# Patient Record
Sex: Female | Born: 1995 | Race: Black or African American | Hispanic: No | Marital: Married | Smoking: Current every day smoker
Health system: Southern US, Community
[De-identification: ages and names within clinical notes are randomized; demographics above are authoritative.]

## PROBLEM LIST (undated history)

## (undated) DIAGNOSIS — Z789 Other specified health status: Secondary | ICD-10-CM

## (undated) DIAGNOSIS — F419 Anxiety disorder, unspecified: Secondary | ICD-10-CM

## (undated) DIAGNOSIS — N739 Female pelvic inflammatory disease, unspecified: Secondary | ICD-10-CM

## (undated) DIAGNOSIS — I10 Essential (primary) hypertension: Secondary | ICD-10-CM

## (undated) DIAGNOSIS — T7840XA Allergy, unspecified, initial encounter: Secondary | ICD-10-CM

## (undated) HISTORY — DX: Other specified health status: Z78.9

## (undated) HISTORY — DX: Female pelvic inflammatory disease, unspecified: N73.9

## (undated) HISTORY — PX: OTHER SURGICAL HISTORY: SHX169

## (undated) HISTORY — DX: Anxiety disorder, unspecified: F41.9

## (undated) HISTORY — DX: Allergy, unspecified, initial encounter: T78.40XA

---
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
## 2016-10-23 ENCOUNTER — Emergency Department: Payer: Self-pay

## 2016-10-23 ENCOUNTER — Emergency Department
Admission: EM | Admit: 2016-10-23 | Discharge: 2016-10-23 | Disposition: A | Discharge status: Home / Self Care | Payer: Self-pay | Attending: Emergency Medicine | Admitting: Emergency Medicine

## 2016-10-23 ENCOUNTER — Encounter: Payer: Self-pay | Admitting: Emergency Medicine

## 2016-10-23 DIAGNOSIS — F17200 Nicotine dependence, unspecified, uncomplicated: Secondary | ICD-10-CM | POA: Insufficient documentation

## 2016-10-23 DIAGNOSIS — S63502A Unspecified sprain of left wrist, initial encounter: Secondary | ICD-10-CM | POA: Insufficient documentation

## 2016-10-23 DIAGNOSIS — S93492A Sprain of other ligament of left ankle, initial encounter: Secondary | ICD-10-CM | POA: Insufficient documentation

## 2016-10-23 DIAGNOSIS — Y939 Activity, unspecified: Secondary | ICD-10-CM | POA: Insufficient documentation

## 2016-10-23 DIAGNOSIS — Y999 Unspecified external cause status: Secondary | ICD-10-CM | POA: Insufficient documentation

## 2016-10-23 DIAGNOSIS — Y929 Unspecified place or not applicable: Secondary | ICD-10-CM | POA: Insufficient documentation

## 2016-10-23 DIAGNOSIS — W010XXA Fall on same level from slipping, tripping and stumbling without subsequent striking against object, initial encounter: Secondary | ICD-10-CM | POA: Insufficient documentation

## 2016-10-23 MED ORDER — ACETAMINOPHEN 325 MG PO TABS: 650.0000 mg | ORAL_TABLET | ORAL | 0 refills | Status: AC | PRN

## 2016-10-23 NOTE — ED Provider Notes (Signed)
[facility] Emergency Department Provider Note   ____________________________________________    I have reviewed the triage vital signs and the nursing notes.   HISTORY  Chief Complaint Fall     HPI Nicole Leonard is a 21 y.o. female who presents with complaints of left wrist pain and left ankle pain. Patient reports yesterday she slipped in the shower and fell onto her left side. She reports she took most of the weight on her left wrist. She reports she is able to ambulate but has significant pain in her left ankle. Her chest was hurting yesterday after the fall but today it feels better. No difficulty breathing. No nausea or vomiting or abdominal pain.   History reviewed. No pertinent past medical history.  There are no active problems to display for this patient.   History reviewed. No pertinent surgical history.  Prior to Admission medications   Medication Sig Start Date End Date Taking? Authorizing Provider  acetaminophen (TYLENOL) 325 MG tablet Take 2 tablets (650 mg total) by mouth every 4 (four) hours as needed. 10/23/16 10/28/16  Jene Every, MD     Allergies Lavender oil and Penicillins  History reviewed. No pertinent family history.  Social History Social History  Substance Use Topics  . Smoking status: Current Every Day Smoker  . Smokeless tobacco: Never Used  . Alcohol use Yes    Review of Systems  Constitutional: No Dizziness  ENT: No neck pain Gastrointestinal: No abdominal pain.  No nausea, no vomiting.    Musculoskeletal: Negative for back pain. As above Skin: Negative for abrasion or laceration Neurological: Negative for headaches     ____________________________________________   PHYSICAL EXAM:  VITAL SIGNS: ED Triage Vitals  Enc Vitals Group     BP 10/23/16 1415 130/81     Pulse Rate 10/23/16 1415 91     Resp 10/23/16 1415 18     Temp 10/23/16 1414 98 F (36.7 C)     Temp Source 10/23/16 1414  Oral     SpO2 10/23/16 1415 100 %     Weight 10/23/16 1414 252 lb (114.3 kg)     Height 10/23/16 1414 5\' 4"  (1.626 m)     Head Circumference --      Peak Flow --      Pain Score 10/23/16 1414 10     Pain Loc --      Pain Edu? --      Excl. in GC? --     Constitutional: Alert and oriented. No acute distress. Pleasant and interactive Eyes: Conjunctivae are normal.  Head: Atraumatic. Nose: No congestion/rhinnorhea. Mouth/Throat: Mucous membranes are moist.  No vertebral tenderness to palpation Cardiovascular: Normal rate, regular rhythm.  Respiratory: Normal respiratory effort.  No retractions. Clear to auscultation bilaterally Genitourinary: deferred Musculoskeletal: Full range of motion of the left wrist, tenderness palpation over the medial aspect, no bony abnormalities felt. Mild swelling along the lateral aspect of the left ankle, no bony abnormality is felt. Warm and well perfused. Neurologic:  Normal speech and language. No gross focal neurologic deficits are appreciated.   Skin:  Skin is warm, dry and intact. No rash noted.   ____________________________________________   LABS (all labs ordered are listed, but only abnormal results are displayed)  Labs Reviewed - No data to display ____________________________________________  EKG   ____________________________________________  RADIOLOGY  Left ankle x-ray Left wrist x-ray ____________________________________________   PROCEDURES  Procedure(s) performed: No    Critical Care performed: No ____________________________________________  INITIAL IMPRESSION / ASSESSMENT AND PLAN / ED COURSE  Pertinent labs & imaging results that were available during my care of the patient were reviewed by me and considered in my medical decision making (see chart for details).  Patient well-appearing and in no acute distress. Suspect ankle and wrist sprain however she is quite concerned about fracture. Pending  x-rays.  X-rays negative for fracture, recommend supportive care/[place]. PCP follow-up as needed. ____________________________________________   FINAL CLINICAL IMPRESSION(S) / ED DIAGNOSES  Final diagnoses:  Sprain of anterior talofibular ligament of left ankle, initial encounter  Wrist sprain, left, initial encounter      NEW MEDICATIONS STARTED DURING THIS VISIT:  Discharge Medication List as of 10/23/2016  3:20 PM    START taking these medications   Details  acetaminophen (TYLENOL) 325 MG tablet Take 2 tablets (650 mg total) by mouth every 4 (four) hours as needed., Starting Tue 10/23/2016, Until Sun 10/28/2016, Print         Note:  This document was prepared using Dragon voice recognition software and may include unintentional dictation errors.    Jene Everyobert Kaniya Trueheart, MD 10/24/16 [phone]

## 2016-10-23 NOTE — ED Notes (Signed)
See triage note  Slipped yesterday in shower having some discomfort in left wrist and ankle  No deformity noted   Positive pulses

## 2016-10-23 NOTE — ED Triage Notes (Addendum)
Pt slipped and fell in shower yesterday. C/o mostly of left wrist and ankle pain.  Has some mild rib pain on left but this has improved. No LOC. Ambulatory to triage. Pt asking for work note.

## 2018-05-04 ENCOUNTER — Encounter: Payer: Self-pay | Admitting: Emergency Medicine

## 2018-05-04 ENCOUNTER — Other Ambulatory Visit: Payer: Self-pay

## 2018-05-04 ENCOUNTER — Emergency Department
Admission: EM | Admit: 2018-05-04 | Discharge: 2018-05-04 | Disposition: A | Discharge status: Home / Self Care | Payer: Self-pay | Attending: Emergency Medicine | Admitting: Emergency Medicine

## 2018-05-04 DIAGNOSIS — N939 Abnormal uterine and vaginal bleeding, unspecified: Secondary | ICD-10-CM

## 2018-05-04 DIAGNOSIS — F17200 Nicotine dependence, unspecified, uncomplicated: Secondary | ICD-10-CM | POA: Insufficient documentation

## 2018-05-04 DIAGNOSIS — N938 Other specified abnormal uterine and vaginal bleeding: Secondary | ICD-10-CM | POA: Insufficient documentation

## 2018-05-04 DIAGNOSIS — Z79899 Other long term (current) drug therapy: Secondary | ICD-10-CM | POA: Insufficient documentation

## 2018-05-04 LAB — LIPASE, BLOOD: Lipase: 26 U/L (ref 11–51)

## 2018-05-04 LAB — CBC WITH DIFFERENTIAL/PLATELET
Basophils Absolute: 0.1 10*3/uL (ref 0–0.1)
Basophils Relative: 1 %
EOS PCT: 4 %
Eosinophils Absolute: 0.5 10*3/uL (ref 0–0.7)
HCT: 40.1 % (ref 35.0–47.0)
Hemoglobin: 13.9 g/dL (ref 12.0–16.0)
LYMPHS ABS: 3 10*3/uL (ref 1.0–3.6)
LYMPHS PCT: 25 %
MCH: 31.5 pg (ref 26.0–34.0)
MCHC: 34.7 g/dL (ref 32.0–36.0)
MCV: 90.8 fL (ref 80.0–100.0)
Monocytes Absolute: 0.7 10*3/uL (ref 0.2–0.9)
Monocytes Relative: 6 %
NEUTROS ABS: 7.5 10*3/uL — AB (ref 1.4–6.5)
NEUTROS PCT: 64 %
Platelets: 380 10*3/uL (ref 150–440)
RBC: 4.41 MIL/uL (ref 3.80–5.20)
RDW: 13 % (ref 11.5–14.5)
WBC: 11.7 10*3/uL — ABNORMAL HIGH (ref 3.6–11.0)

## 2018-05-04 LAB — BASIC METABOLIC PANEL
Anion gap: 5 (ref 5–15)
BUN: 7 mg/dL (ref 6–20)
CHLORIDE: 108 mmol/L (ref 98–111)
CO2: 25 mmol/L (ref 22–32)
CREATININE: 0.69 mg/dL (ref 0.44–1.00)
Calcium: 9 mg/dL (ref 8.9–10.3)
GFR calc Af Amer: >60 mL/min (ref >60)
GFR calc non Af Amer: >60 mL/min (ref >60)
GLUCOSE: 95 mg/dL (ref 70–99)
Potassium: 3.9 mmol/L (ref 3.5–5.1)
SODIUM: 138 mmol/L (ref 135–145)

## 2018-05-04 LAB — TYPE AND SCREEN
ABO/RH(D): O POS
Antibody Screen: NEGATIVE

## 2018-05-04 LAB — CHLAMYDIA/NGC RT PCR (ARMC ONLY)
Chlamydia Tr: NOT DETECTED
N gonorrhoeae: NOT DETECTED

## 2018-05-04 LAB — HCG, QUANTITATIVE, PREGNANCY: hCG, Beta Chain, Quant, S: <1 m[IU]/mL (ref <5)

## 2018-05-04 NOTE — ED Triage Notes (Signed)
Pt arrives ambulatory to triage with c/o vaginal bleeding x 3 days. Pt reports that she is going through two pads per hour and is experiencing multiple clots. Pt also states that the last time that she felt like this, she was diagnosed PID x 2 years ago. Pt is in NAD.

## 2018-05-04 NOTE — ED Notes (Signed)
NAD noted at time of D/C. Pt denies questions or concerns. Pt ambulatory to the lobby at this time.  

## 2018-05-04 NOTE — ED Provider Notes (Signed)
[facility] Emergency Department Provider Note  Time seen: 8:25 PM  I have reviewed the triage vital signs and the nursing notes.   HISTORY  Chief Complaint Vaginal Bleeding    HPI Nicole Leonard is a 22 y.o. female with no significant past medical history presents to the emergency department for vaginal bleeding.  According to the patient she is on Nexplanon and has not had a period in approximately 3 years however last month she began spotting, and this month she had a menstrual period which she describes as normal to heavy.  States yesterday she was having heavy bleeding having to change her pad multiple times, today she states it is like a normal period.  Denies any vaginal spotting.  Denies any abdominal pain besides mild lower abdominal cramping consistent with her menstrual period.  Patient states she came to the emergency department today to find out why she is bleeding when she is on Nexplanon, as well as if this could be pelvic inflammatory disease.  States she was diagnosed with PID 2 years ago with lower abdominal cramping.  However she denies any discharge, states she is only sexually active with one partner that has been long-term and does not believe she is at risk for STDs.   History reviewed. No pertinent past medical history.  There are no active problems to display for this patient.   History reviewed. No pertinent surgical history.  Prior to Admission medications   Not on File    Allergies  Allergen Reactions  . Lavender Oil Hives  . Penicillins Other (See Comments)    Unknown, was as child    No family history on file.  Social History Social History   Tobacco Use  . Smoking status: Current Every Day Smoker  . Smokeless tobacco: Never Used  Substance Use Topics  . Alcohol use: Yes  . Drug use: Yes    Types: Marijuana    Review of Systems Constitutional: Negative for fever. Cardiovascular: Negative for chest  pain. Respiratory: Negative for shortness of breath. Gastrointestinal: Mild lower abdominal cramping. Genitourinary: Positive for vaginal bleeding.  Negative for discharge. Musculoskeletal: Negative for musculoskeletal complaints Skin: Negative for skin complaints  Neurological: Negative for headache All other ROS negative  ____________________________________________   PHYSICAL EXAM:  VITAL SIGNS: ED Triage Vitals  Enc Vitals Group     BP 05/04/18 1922 129/71     Pulse Rate 05/04/18 1922 76     Resp 05/04/18 1922 18     Temp 05/04/18 1922 98.6 F (37 C)     Temp Source 05/04/18 1922 Oral     SpO2 05/04/18 1922 100 %     Weight 05/04/18 1925 275 lb (124.7 kg)     Height 05/04/18 1925 5\' 5"  (1.651 m)     Head Circumference --      Peak Flow --      Pain Score 05/04/18 1925 7     Pain Loc --      Pain Edu? --      Excl. in GC? --    Constitutional: Alert and oriented. Well appearing and in no distress. Eyes: Normal exam ENT   Head: Normocephalic and atraumatic.   Mouth/Throat: Mucous membranes are moist. Cardiovascular: Normal rate, regular rhythm. No murmur Respiratory: Normal respiratory effort without tachypnea nor retractions. Breath sounds are clear  Gastrointestinal: Soft and nontender. No distention.   Musculoskeletal: Nontender with normal range of motion in all extremities.  Neurologic:  Normal speech  and language. No gross focal neurologic deficits  Skin:  Skin is warm, dry and intact.  Psychiatric: Mood and affect are normal.  ____________________________________________  INITIAL IMPRESSION / ASSESSMENT AND PLAN / ED COURSE  Pertinent labs & imaging results that were available during my care of the patient were reviewed by me and considered in my medical decision making (see chart for details).  Patient presents to the emergency department for vaginal bleeding.  I discussed with the patient being on progesterone only birth control she is at risk for  breakthrough bleeding which can be spotting or heavy at times.  We will check a beta-hCG quantitative level to rule out pregnancy.  We will also obtain a urine sample to evaluate for gonorrhea/chlamydia.  Overall patient appears well benign abdominal exam.  Labs are pending.  Patient is labs are resulted largely within normal limits.  H&H is normal.  Patency test is negative.  Gonorrhea/chlamydia is pending.  However the patient does not believe she is at high risk we will hold off on treatment until it is resulted.  ____________________________________________   FINAL CLINICAL IMPRESSION(S) / ED DIAGNOSES  Vaginal bleeding    Minna Antis, MD 05/04/18 2028

## 2018-09-10 ENCOUNTER — Emergency Department
Admission: EM | Admit: 2018-09-10 | Discharge: 2018-09-10 | Disposition: A | Discharge status: Home / Self Care | Payer: Medicaid Other | Attending: Emergency Medicine | Admitting: Emergency Medicine

## 2018-09-10 ENCOUNTER — Encounter: Payer: Self-pay | Admitting: Emergency Medicine

## 2018-09-10 DIAGNOSIS — J069 Acute upper respiratory infection, unspecified: Secondary | ICD-10-CM | POA: Insufficient documentation

## 2018-09-10 DIAGNOSIS — F17210 Nicotine dependence, cigarettes, uncomplicated: Secondary | ICD-10-CM | POA: Insufficient documentation

## 2018-09-10 MED ORDER — AZITHROMYCIN 250 MG PO TABS: ORAL_TABLET | ORAL | 0 refills | Status: DC

## 2018-09-10 NOTE — ED Provider Notes (Signed)
[facility] Emergency Department Provider Note  ____________________________________________   First MD Initiated Contact with Patient 09/10/18 [phone]     (approximate)  I have reviewed the triage vital signs and the nursing notes.   HISTORY  Chief Complaint Nasal Congestion; Sore Throat; and Cough    HPI Nicole Leonard is a 23 y.o. female 5- presents emergency department complaining of upper respiratory symptoms.  Symptoms for 1 week.  She denies fever, chills, chest pain or shortness of breath.  She states mucus is been yellow to green the entire time.  She denies any vomiting or diarrhea.  She states needs a work note.    History reviewed. No pertinent past medical history.  There are no active problems to display for this patient.   History reviewed. No pertinent surgical history.  Prior to Admission medications   Medication Sig Start Date End Date Taking? Authorizing Provider  azithromycin (ZITHROMAX Z-PAK) 250 MG tablet 2 pills today then 1 pill a day for 4 days 09/10/18   Faythe Ghee, PA-C    Allergies Lavender oil and Penicillins  No family history on file.  Social History Social History   Tobacco Use  . Smoking status: Current Every Day Smoker  . Smokeless tobacco: Never Used  Substance Use Topics  . Alcohol use: Yes  . Drug use: Yes    Types: Marijuana    Review of Systems  Constitutional: No fever/chills Eyes: No visual changes. ENT: No sore throat. Respiratory: Positive cough Genitourinary: Negative for dysuria. Musculoskeletal: Negative for back pain. Skin: Negative for rash.    ____________________________________________   PHYSICAL EXAM:  VITAL SIGNS: ED Triage Vitals  Enc Vitals Group     BP 09/10/18 0931 131/77     Pulse --      Resp 09/10/18 0931 18     Temp 09/10/18 0931 98 F (36.7 C)     Temp Source 09/10/18 0931 Oral     SpO2 09/10/18 0931 95 %     Weight 09/10/18 0926 278 lb (126.1 kg)    Height 09/10/18 0926 5\' 3"  (1.6 m)     Head Circumference --      Peak Flow --      Pain Score 09/10/18 0926 0     Pain Loc --      Pain Edu? --      Excl. in GC? --     Constitutional: Alert and oriented. Well appearing and in no acute distress. Eyes: Conjunctivae are normal.  Head: Atraumatic. Nose: No congestion/rhinnorhea. Mouth/Throat: Mucous membranes are moist.   Neck:  supple no lymphadenopathy noted Cardiovascular: Normal rate, regular rhythm. Heart sounds are normal Respiratory: Normal respiratory effort.  No retractions, lungs c t a  GU: deferred Musculoskeletal: FROM all extremities, warm and well perfused Neurologic:  Normal speech and language.  Skin:  Skin is warm, dry and intact. No rash noted. Psychiatric: Mood and affect are normal. Speech and behavior are normal.  ____________________________________________   LABS (all labs ordered are listed, but only abnormal results are displayed)  Labs Reviewed - No data to display ____________________________________________   ____________________________________________  RADIOLOGY    ____________________________________________   PROCEDURES  Procedure(s) performed: No  Procedures    ____________________________________________   INITIAL IMPRESSION / ASSESSMENT AND PLAN / ED COURSE  Pertinent labs & imaging results that were available during my care of the patient were reviewed by me and considered in my medical decision making (see chart for details).  Patient is a 23 year old female presents emergency department with URI symptoms.  Physical exam shows a dry cough.  Remainder the exam is basically unremarkable.  Explained to the parent and the patient that she has an acute upper respiratory infection.  She is given a prescription for Z-Pak.  She was given a work note as requested.  She discharged stable condition.     As part of my medical decision making, I reviewed the following data within  the electronic MEDICAL RECORD NUMBER History obtained from family, Nursing notes reviewed and incorporated, Old chart reviewed, Notes from prior ED visits and [place] Controlled Substance Database  ____________________________________________   FINAL CLINICAL IMPRESSION(S) / ED DIAGNOSES  Final diagnoses:  Acute upper respiratory infection      NEW MEDICATIONS STARTED DURING THIS VISIT:  New Prescriptions   AZITHROMYCIN (ZITHROMAX Z-PAK) 250 MG TABLET    2 pills today then 1 pill a day for 4 days     Note:  This document was prepared using Dragon voice recognition software and may include unintentional dictation errors.    Faythe Ghee, PA-C 09/10/18 1032    Phineas Semen, MD 09/10/18 [phone]

## 2018-09-10 NOTE — ED Triage Notes (Signed)
Pt reports sore throat, nasal congestion and cough since last week. Denies fevers, SOB.

## 2018-09-10 NOTE — Discharge Instructions (Addendum)
Follow-up with your regular doctor if not better in 3 days.  Return emergency department worsening.  Use medication as prescribed.  Use over-the-counter cold medicines as needed.  Drink plenty of fluids as this will help thin the mucus free to get it out.

## 2019-07-02 ENCOUNTER — Other Ambulatory Visit: Payer: Self-pay

## 2019-07-02 ENCOUNTER — Emergency Department
Admission: EM | Admit: 2019-07-02 | Discharge: 2019-07-02 | Disposition: A | Discharge status: Home / Self Care | Payer: Medicaid Other | Attending: Emergency Medicine | Admitting: Emergency Medicine

## 2019-07-02 ENCOUNTER — Encounter: Payer: Self-pay | Admitting: Emergency Medicine

## 2019-07-02 DIAGNOSIS — F17200 Nicotine dependence, unspecified, uncomplicated: Secondary | ICD-10-CM | POA: Insufficient documentation

## 2019-07-02 DIAGNOSIS — Y939 Activity, unspecified: Secondary | ICD-10-CM | POA: Insufficient documentation

## 2019-07-02 DIAGNOSIS — Y999 Unspecified external cause status: Secondary | ICD-10-CM | POA: Insufficient documentation

## 2019-07-02 DIAGNOSIS — T148XXA Other injury of unspecified body region, initial encounter: Secondary | ICD-10-CM

## 2019-07-02 DIAGNOSIS — Y929 Unspecified place or not applicable: Secondary | ICD-10-CM | POA: Insufficient documentation

## 2019-07-02 DIAGNOSIS — X58XXXA Exposure to other specified factors, initial encounter: Secondary | ICD-10-CM | POA: Insufficient documentation

## 2019-07-02 DIAGNOSIS — M79605 Pain in left leg: Secondary | ICD-10-CM

## 2019-07-02 DIAGNOSIS — S76912A Strain of unspecified muscles, fascia and tendons at thigh level, left thigh, initial encounter: Secondary | ICD-10-CM | POA: Insufficient documentation

## 2019-07-02 MED ORDER — CYCLOBENZAPRINE HCL 5 MG PO TABS: 5.0000 mg | ORAL_TABLET | Freq: Three times a day (TID) | ORAL | 0 refills | Status: AC | PRN

## 2019-07-02 MED ORDER — NAPROXEN 500 MG PO TABS: 500.0000 mg | ORAL_TABLET | Freq: Two times a day (BID) | ORAL | 0 refills | Status: AC

## 2019-07-02 NOTE — ED Notes (Signed)
See triage note  Presents with left leg pain  States pain radiates into left leg from hip   States this has been going on for some time  States her leg gives out occasionally

## 2019-07-02 NOTE — ED Provider Notes (Signed)
[facility] Emergency Department Provider Note ____________________________________________  Time seen: 1909  I have reviewed the triage vital signs and the nursing notes.  HISTORY  Chief Complaint  Leg Pain  HPI Nicole Leonard is a 23 y.o. female presents to the ED for evaluation of intermittent left anterior thigh pain for several weeks.  Patient denies any preceding injury, accident, trauma, fall patient denies any bladder bowel incontinence, foot drop, or saddle anesthesias.  She typically takes Tylenol or Motrin for the pain and reports improvement after several doses.  She does report sometimes she has had some buckling in her knee secondary to the anterior thigh pain she denies any other providers for this condition.  She denies any other injury at this time.  Patient is requesting a work note at this time.  History reviewed. No pertinent past medical history.  There are no active problems to display for this patient.  History reviewed. No pertinent surgical history.  Prior to Admission medications   Medication Sig Start Date End Date Taking? Authorizing Provider  cyclobenzaprine (FLEXERIL) 5 MG tablet Take 1 tablet (5 mg total) by mouth 3 (three) times daily as needed for up to 3 days. 07/02/19 07/05/19  Lydiah Pong, Dannielle Karvonen, PA-C  naproxen (NAPROSYN) 500 MG tablet Take 1 tablet (500 mg total) by mouth 2 (two) times daily with a meal for 15 days. 07/02/19 07/17/19  Tinleigh Whitmire, Dannielle Karvonen, PA-C    Allergies Lavender oil and Penicillins  No family history on file.  Social History Social History   Tobacco Use  . Smoking status: Current Every Day Smoker  . Smokeless tobacco: Never Used  Substance Use Topics  . Alcohol use: Yes  . Drug use: Yes    Types: Marijuana    Review of Systems  Constitutional: Negative for fever. Cardiovascular: Negative for chest pain. Respiratory: Negative for shortness of breath. Gastrointestinal: Negative for  abdominal pain, vomiting and diarrhea. Genitourinary: Negative for dysuria. Musculoskeletal: Negative for back pain.  Left anterior thigh pain as above. Skin: Negative for rash. Neurological: Negative for headaches, focal weakness or numbness. ____________________________________________  PHYSICAL EXAM:  VITAL SIGNS: ED Triage Vitals  Enc Vitals Group     BP 07/02/19 1757 122/77     Pulse Rate 07/02/19 1757 88     Resp 07/02/19 1757 20     Temp 07/02/19 1757 98.1 F (36.7 C)     Temp Source 07/02/19 1757 Oral     SpO2 07/02/19 1757 99 %     Weight 07/02/19 1756 250 lb (113.4 kg)     Height 07/02/19 1756 5\' 6"  (1.676 m)     Head Circumference --      Peak Flow --      Pain Score 07/02/19 1756 6     Pain Loc --      Pain Edu? --      Excl. in [place]? --     Constitutional: Alert and oriented. Well appearing and in no distress. Head: Normocephalic and atraumatic. Eyes: Conjunctivae are normal. Normal extraocular movements Cardiovascular: Normal rate, regular rhythm. Normal distal pulses. Respiratory: Normal respiratory effort. No wheezes/rales/rhonchi. Gastrointestinal: Soft and nontender. No distention. Musculoskeletal: Left leg with normal active flexion at the hip and pelvis.  No calf or Achilles tenderness is elicited.  Nontender with normal range of motion in all extremities.  Neurologic: Cranial nerves II through XII grossly intact.  Normal LE DTRs bilaterally.  Normal gait without ataxia. Normal speech and language. No  gross focal neurologic deficits are appreciated. Skin:  Skin is warm, dry and intact. No rash noted. ____________________________________________  PROCEDURES  Procedures ____________________________________________  INITIAL IMPRESSION / ASSESSMENT AND PLAN / ED COURSE  Patient with ED evaluation of intermittent left thigh pain without any signs of acute neuromuscular deficit.  She is discharged at this time with prescriptions for anti-inflammatories and  muscle relaxants to take as directed.  She will follow with primary provider return to the ED as needed.  Work note is provided for today as requested.  Nicole Leonard was evaluated in Emergency Department on 07/02/2019 for the symptoms described in the history of present illness. She was evaluated in the context of the global COVID-19 pandemic, which necessitated consideration that the patient might be at risk for infection with the SARS-CoV-2 virus that causes COVID-19. Institutional protocols and algorithms that pertain to the evaluation of patients at risk for COVID-19 are in a state of rapid change based on information released by regulatory bodies including the CDC and federal and state organizations. These policies and algorithms were followed during the patient's care in the ED. ____________________________________________  FINAL CLINICAL IMPRESSION(S) / ED DIAGNOSES  Final diagnoses:  Left leg pain  Muscle strain      Lissa Hoard, PA-C 07/03/19 1920    Arnaldo Natal, MD 07/03/19 2359

## 2019-07-02 NOTE — ED Triage Notes (Signed)
Pt reports pain to her left leg intermittently for several weeks. Pt reports the pain starts in her left hip and radiates down her leg. Pt rep[orts sometimes her leg buckles.

## 2019-07-02 NOTE — Discharge Instructions (Signed)
Your exam is consistent with muscle strain to the left leg.  No signs of any serious injury to the leg or spine.  Take the prescription medications as provided.  [facility].  Return to the ED as needed.

## 2019-12-23 ENCOUNTER — Other Ambulatory Visit: Payer: Self-pay

## 2019-12-23 ENCOUNTER — Encounter: Payer: Self-pay | Admitting: Emergency Medicine

## 2019-12-23 ENCOUNTER — Emergency Department
Admission: EM | Admit: 2019-12-23 | Discharge: 2019-12-23 | Disposition: A | Discharge status: Home / Self Care | Payer: BC Managed Care – PPO | Attending: Emergency Medicine | Admitting: Emergency Medicine

## 2019-12-23 DIAGNOSIS — F17200 Nicotine dependence, unspecified, uncomplicated: Secondary | ICD-10-CM | POA: Insufficient documentation

## 2019-12-23 DIAGNOSIS — R197 Diarrhea, unspecified: Secondary | ICD-10-CM | POA: Diagnosis not present

## 2019-12-23 DIAGNOSIS — R1110 Vomiting, unspecified: Secondary | ICD-10-CM | POA: Diagnosis not present

## 2019-12-23 DIAGNOSIS — K529 Noninfective gastroenteritis and colitis, unspecified: Secondary | ICD-10-CM

## 2019-12-23 LAB — CBC
HCT: 45.5 % (ref 36.0–46.0)
Hemoglobin: 15 g/dL (ref 12.0–15.0)
MCH: 30.3 pg (ref 26.0–34.0)
MCHC: 33 g/dL (ref 30.0–36.0)
MCV: 91.9 fL (ref 80.0–100.0)
Platelets: 369 10*3/uL (ref 150–400)
RBC: 4.95 MIL/uL (ref 3.87–5.11)
RDW: 12.6 % (ref 11.5–15.5)
WBC: 11.7 10*3/uL — ABNORMAL HIGH (ref 4.0–10.5)
nRBC: 0 % (ref 0.0–0.2)

## 2019-12-23 LAB — COMPREHENSIVE METABOLIC PANEL
ALT: 20 U/L (ref 0–44)
AST: 15 U/L (ref 15–41)
Albumin: 4.3 g/dL (ref 3.5–5.0)
Alkaline Phosphatase: 72 U/L (ref 38–126)
Anion gap: 9 (ref 5–15)
BUN: 9 mg/dL (ref 6–20)
CO2: 23 mmol/L (ref 22–32)
Calcium: 9.5 mg/dL (ref 8.9–10.3)
Chloride: 104 mmol/L (ref 98–111)
Creatinine, Ser: 0.8 mg/dL (ref 0.44–1.00)
GFR calc Af Amer: >60 mL/min (ref >60)
GFR calc non Af Amer: >60 mL/min (ref >60)
Glucose, Bld: 103 mg/dL — ABNORMAL HIGH (ref 70–99)
Potassium: 3.9 mmol/L (ref 3.5–5.1)
Sodium: 136 mmol/L (ref 135–145)
Total Bilirubin: 1.9 mg/dL — ABNORMAL HIGH (ref 0.3–1.2)
Total Protein: 8.1 g/dL (ref 6.5–8.1)

## 2019-12-23 LAB — URINALYSIS, COMPLETE (UACMP) WITH MICROSCOPIC
Bacteria, UA: NONE SEEN
Bilirubin Urine: NEGATIVE
Glucose, UA: NEGATIVE mg/dL
Hgb urine dipstick: NEGATIVE
Ketones, ur: 80 mg/dL — AB
Leukocytes,Ua: NEGATIVE
Nitrite: NEGATIVE
Protein, ur: 30 mg/dL — AB
Specific Gravity, Urine: 1.033 — ABNORMAL HIGH (ref 1.005–1.030)
pH: 5 (ref 5.0–8.0)

## 2019-12-23 LAB — LIPASE, BLOOD: Lipase: 19 U/L (ref 11–51)

## 2019-12-23 LAB — POCT PREGNANCY, URINE: Preg Test, Ur: NEGATIVE

## 2019-12-23 MED ORDER — LOPERAMIDE HCL 2 MG PO CAPS
4.0000 mg | ORAL_CAPSULE | Freq: Once | ORAL | Status: AC
  Administered 2019-12-23: 4 mg via ORAL
  Filled 2019-12-23: qty 2

## 2019-12-23 MED ORDER — SODIUM CHLORIDE 0.9 % IV SOLN: Freq: Once | INTRAVENOUS | Status: AC

## 2019-12-23 MED ORDER — ONDANSETRON HCL 4 MG/2ML IJ SOLN
4.0000 mg | Freq: Once | INTRAMUSCULAR | Status: AC
  Administered 2019-12-23: 4 mg via INTRAVENOUS
  Filled 2019-12-23: qty 2

## 2019-12-23 MED ORDER — ONDANSETRON HCL 4 MG/2ML IJ SOLN
4.0000 mg | Freq: Once | INTRAMUSCULAR | Status: AC
Start: 2019-12-23 — End: 2019-12-23
  Administered 2019-12-23: 4 mg via INTRAVENOUS
  Filled 2019-12-23: qty 2

## 2019-12-23 MED ORDER — ONDANSETRON HCL 4 MG PO TABS: 4.0000 mg | ORAL_TABLET | Freq: Every day | ORAL | 1 refills | Status: DC | PRN

## 2019-12-23 NOTE — ED Triage Notes (Signed)
PT to ER with c/o vomiting and diarrheas with chills since yesterday.  Pt denies abdominal pain.

## 2019-12-23 NOTE — ED Provider Notes (Signed)
[facility] Emergency Department Provider Note       Time seen: ----------------------------------------- 10:03 AM on 12/23/2019 -----------------------------------------   I have reviewed the triage vital signs and the nursing notes.  HISTORY   Chief Complaint Emesis and Diarrhea    HPI Nicole Leonard is a 24 y.o. female with no significant past medical history who presents to the ED for vomiting and diarrhea with chills since yesterday.  She has not had this happen before, currently takes oral contraceptive pills.  Patient denies any abdominal pain.  History reviewed. No pertinent past medical history.  There are no problems to display for this patient.   History reviewed. No pertinent surgical history.  Allergies Lavender oil and Penicillins  Social History Social History   Tobacco Use  . Smoking status: Current Every Day Smoker  . Smokeless tobacco: Never Used  Substance Use Topics  . Alcohol use: Yes  . Drug use: Yes    Types: Marijuana    Review of Systems Constitutional: Negative for fever.  Positive for chills Cardiovascular: Negative for chest pain. Respiratory: Negative for shortness of breath. Gastrointestinal: Negative for abdominal pain, positive for vomiting and diarrhea Musculoskeletal: Negative for back pain. Skin: Negative for rash. Neurological: Negative for headaches, focal weakness or numbness.  All systems negative/normal/unremarkable except as stated in the HPI  ____________________________________________   PHYSICAL EXAM:  VITAL SIGNS: ED Triage Vitals  Enc Vitals Group     BP 12/23/19 0833 (!) 143/88     Pulse Rate 12/23/19 0833 (!) 105     Resp 12/23/19 0833 18     Temp 12/23/19 0833 98.5 F (36.9 C)     Temp Source 12/23/19 0833 Oral     SpO2 12/23/19 0833 99 %     Weight 12/23/19 0832 280 lb (127 kg)     Height 12/23/19 0832 5\' 4"  (1.626 m)     Head Circumference --      Peak Flow --    Pain Score 12/23/19 0840 0     Pain Loc --      Pain Edu? --      Excl. in GC? --     Constitutional: Alert and oriented. Well appearing and in no distress. Eyes: Conjunctivae are normal. Normal extraocular movements. Cardiovascular: Normal rate, regular rhythm. No murmurs, rubs, or gallops. Respiratory: Normal respiratory effort without tachypnea nor retractions. Breath sounds are clear and equal bilaterally. No wheezes/rales/rhonchi. Gastrointestinal: Soft and nontender. Normal bowel sounds Musculoskeletal: Nontender with normal range of motion in extremities. No lower extremity tenderness nor edema. Neurologic:  Normal speech and language. No gross focal neurologic deficits are appreciated.  Skin:  Skin is warm, dry and intact. No rash noted. Psychiatric: Mood and affect are normal. Speech and behavior are normal.  ___________________________________________  ED COURSE:  As part of my medical decision making, I reviewed the following data within the electronic MEDICAL RECORD NUMBER History obtained from family if available, nursing notes, old chart and ekg, as well as notes from prior ED visits. Patient presented for vomiting and diarrhea, we will assess with labs and imaging as indicated at this time.   Procedures  Nicole Leonard was evaluated in Emergency Department on 12/23/2019 for the symptoms described in the history of present illness. She was evaluated in the context of the global COVID-19 pandemic, which necessitated consideration that the patient might be at risk for infection with the SARS-CoV-2 virus that causes COVID-19. Institutional protocols and algorithms that pertain to  the evaluation of patients at risk for COVID-19 are in a state of rapid change based on information released by regulatory bodies including the CDC and federal and state organizations. These policies and algorithms were followed during the patient's care in the ED.  ____________________________________________    LABS (pertinent positives/negatives)  Labs Reviewed  COMPREHENSIVE METABOLIC PANEL - Abnormal; Notable for the following components:      Result Value   Glucose, Bld 103 (*)    Total Bilirubin 1.9 (*)    All other components within normal limits  CBC - Abnormal; Notable for the following components:   WBC 11.7 (*)    All other components within normal limits  URINALYSIS, COMPLETE (UACMP) WITH MICROSCOPIC - Abnormal; Notable for the following components:   Color, Urine AMBER (*)    APPearance HAZY (*)    Specific Gravity, Urine 1.033 (*)    Ketones, ur 80 (*)    Protein, ur 30 (*)    All other components within normal limits  LIPASE, BLOOD  POC URINE PREG, ED  POCT PREGNANCY, URINE   ____________________________________________   DIFFERENTIAL DIAGNOSIS   Gastroenteritis, dehydration, electrolyte abnormality  FINAL ASSESSMENT AND PLAN  Vomiting and diarrhea   Plan: The patient had presented for vomiting and diarrhea with chills. Patient's labs were overall reassuring.  She is currently feeling better, will be discharged with antiemetics and close outpatient follow-up as needed.   Laurence Aly, MD    Note: This note was generated in part or whole with voice recognition software. Voice recognition is usually quite accurate but there are transcription errors that can and very often do occur. I apologize for any typographical errors that were not detected and corrected.     Earleen Newport, MD 12/23/19 1136

## 2020-02-23 ENCOUNTER — Telehealth: Payer: Self-pay

## 2020-02-23 NOTE — Telephone Encounter (Signed)
Individual has been contacted multiple times regarding ED referral. They seem to have insurance and no further attempts to contact individual will be made.

## 2020-04-23 ENCOUNTER — Emergency Department
Admission: EM | Admit: 2020-04-23 | Discharge: 2020-04-23 | Discharge status: Against Medical Advice | Payer: BC Managed Care – PPO

## 2020-08-06 DIAGNOSIS — Z113 Encounter for screening for infections with a predominantly sexual mode of transmission: Secondary | ICD-10-CM | POA: Diagnosis not present

## 2020-08-06 DIAGNOSIS — Z3202 Encounter for pregnancy test, result negative: Secondary | ICD-10-CM | POA: Diagnosis not present

## 2021-03-15 ENCOUNTER — Other Ambulatory Visit: Payer: Self-pay

## 2021-03-15 ENCOUNTER — Ambulatory Visit: Payer: Self-pay

## 2021-03-15 ENCOUNTER — Encounter: Payer: Self-pay | Admitting: Physician Assistant

## 2021-03-15 ENCOUNTER — Ambulatory Visit (LOCAL_COMMUNITY_HEALTH_CENTER): Payer: BC Managed Care – PPO | Admitting: Physician Assistant

## 2021-03-15 VITALS — BP 126/81 | Ht 62.5 in | Wt 265.8 lb

## 2021-03-15 DIAGNOSIS — Z3009 Encounter for other general counseling and advice on contraception: Secondary | ICD-10-CM | POA: Diagnosis not present

## 2021-03-15 DIAGNOSIS — Z3046 Encounter for surveillance of implantable subdermal contraceptive: Secondary | ICD-10-CM | POA: Diagnosis not present

## 2021-03-15 DIAGNOSIS — Z113 Encounter for screening for infections with a predominantly sexual mode of transmission: Secondary | ICD-10-CM

## 2021-03-15 DIAGNOSIS — Z0000 Encounter for general adult medical examination without abnormal findings: Secondary | ICD-10-CM | POA: Diagnosis not present

## 2021-03-15 DIAGNOSIS — Z30011 Encounter for initial prescription of contraceptive pills: Secondary | ICD-10-CM | POA: Diagnosis not present

## 2021-03-15 DIAGNOSIS — F419 Anxiety disorder, unspecified: Secondary | ICD-10-CM

## 2021-03-15 MED ORDER — NORGESTIMATE-ETH ESTRADIOL 0.25-35 MG-MCG PO TABS: 1.0000 | ORAL_TABLET | Freq: Every day | ORAL | 12 refills | Status: DC

## 2021-03-15 NOTE — Progress Notes (Signed)
Pt accepted condoms and was counseled to use back-up method like condoms for at least 2 weeks. Pt reviewed and signed consent for OCP and for LCSW services. LCSW and [employer] business cards accepted by pt. Provider orders completed.

## 2021-03-15 NOTE — Progress Notes (Signed)
Pt to clinic for physical and nexplanon remoal;  wants to discuss other [facility] options. Pt states she wants to get her period, is having a tightness in lower abdomen/uterine area. Pt states she has a bump that appears sporadically over nexplanon site. Pt is completing PHQ9.

## 2021-03-16 ENCOUNTER — Encounter: Payer: Self-pay | Admitting: Physician Assistant

## 2021-03-16 NOTE — Progress Notes (Addendum)
[facility] DEPARTMENT [facility] [address] Main Number: [phone]    Family Planning Visit- Annual Visit  Subjective:  Nicole Leonard is a 25 y.o.  [id number]   being seen today for an annual visit and to discuss contraceptive options.  The patient is currently using Nexplanon for pregnancy prevention. Patient reports she does not want a pregnancy in the next year.  Patient has the following medical conditions does not have a problem list on file.  Chief Complaint  Patient presents with   Contraception    Annual exam and Nexplanon removal, discuss other forms of birth control.    Patient reports that she wants to have her Nexplanon removed and change to other method where she would have her period.  Reports that she was previously on OCP, but had trouble remembering to take them on time.  Patient states that she thinks that the lower abdominal discomfort may be due to constipation and denies vaginal symptoms, change to bladder habits.  PHQ-9=6 and patient endorses that she does feel anxious and has felt so anxious that she is not able to eat or if she does eat she vomits.  Patient denies thoughts of harming self or others and requests LCSW referral today.  Reports occasional headaches that are relieved with OTC analgesics.  Reports that she has noticed in the past few days that she has a short of breath feeling if she drinks a soda with red dye in it or eats pizza.  Patient states that she has not had this happen in the past.  Per chart review, CBE and pap are due in 2023.  Patient denies any other concerns today.    Body mass index is 47.84 kg/m. - Patient is eligible for diabetes screening based on BMI and age >2?  not applicable HA1C ordered? not applicable  Patient reports 2  partner/s in last year. Desires STI screening? No   Has patient been screened once for HCV in the past?    No results found for: HCVAB  Does the patient have  current drug use (including MJ), have a partner with drug use, and/or has been incarcerated since last result? No  If yes-- Screen for HCV through [facility]   Does the patient meet criteria for HBV testing? No  Criteria:  -Household, sexual or needle sharing contact with HBV -History of drug use -HIV positive -Those with known Hep C   Health Maintenance Due  Topic Date Due   COVID-19 Vaccine (1) Never done   Pneumococcal Vaccine 54-56 Years old (1 - PCV) Never done   HPV VACCINES (1 - 2-dose series) Never done   HIV Screening  Never done   Hepatitis C Screening  Never done   TETANUS/TDAP  Never done    Review of Systems  All other systems reviewed and are negative.  The following portions of the patient's history were reviewed and updated as appropriate: allergies, current medications, past family history, past medical history, past social history, past surgical history and problem list. Problem list updated.   See flowsheet for other program required questions.  Objective:   Vitals:   03/15/21 0821  BP: 126/81  Weight: 265 lb 12.8 oz (120.6 kg)  Height: 5' 2.5" (1.588 m)    Physical Exam Vitals and nursing note reviewed.  Constitutional:      General: She is not in acute distress.    Appearance: Normal appearance.  HENT:  Head: Normocephalic and atraumatic.     Mouth/Throat:     Mouth: Mucous membranes are moist.     Pharynx: Oropharynx is clear. No oropharyngeal exudate or posterior oropharyngeal erythema.  Eyes:     Conjunctiva/sclera: Conjunctivae normal.  Neck:     Thyroid: No thyroid mass, thyromegaly or thyroid tenderness.  Cardiovascular:     Rate and Rhythm: Normal rate and regular rhythm.  Pulmonary:     Effort: Pulmonary effort is normal.     Breath sounds: Normal breath sounds.  Abdominal:     Palpations: Abdomen is soft. There is no mass.     Tenderness: There is no abdominal tenderness. There is no guarding or rebound.  Musculoskeletal:      Cervical back: Neck supple. No tenderness.  Lymphadenopathy:     Cervical: No cervical adenopathy.  Skin:    General: Skin is warm and dry.     Findings: No bruising, erythema, lesion or rash.  Neurological:     Mental Status: She is alert and oriented to person, place, and time.  Psychiatric:        Mood and Affect: Mood normal.        Behavior: Behavior normal.        Thought Content: Thought content normal.        Judgment: Judgment normal.      Assessment and Plan:  Nicole Leonard is a 25 y.o. female presenting to the [facility] Department for an initial annual wellness/contraceptive visit  Contraception counseling: Reviewed all forms of birth control options in the tiered based approach. available including abstinence; over the counter/barrier methods; hormonal contraceptive medication including pill, patch, ring, injection,contraceptive implant, ECP; hormonal and nonhormonal IUDs; permanent sterilization options including vasectomy and the various tubal sterilization modalities. Risks, benefits, and typical effectiveness rates were reviewed.  Questions were answered.  Written information was also given to the patient to review.  Patient desires to start OCP, this was prescribed for patient. She will follow up in  1 year and prn for surveillance.  She was told to call with any further questions, or with any concerns about this method of contraception.  Emphasized use of condoms 100% of the time for STI prevention.  Patient was not a candidate for ECP today.  1. Encounter for counseling regarding contraception Reviewed with patient as above re: BCM options. Discussed in particular with patient options for regular periods; OCP, patch and ring. Reviewed with patient normal SE of OCP and when to call clinic with concerns. Enc condoms with all sex for STD protection.   2. Well woman exam (no gynecological exam) Reviewed with patient healthy habits to maintain general  health. Enc patient to avoid products with red dye until she is evaluated by PCP or allergist. Enc patient to try taking Tums or other antiacid 15-30 minutes prior to meals, especially those with high acid content like pizza, spicy foods, etc., sit upright for 2 hr after eating, avoid caffeine, chocolate and mints. Enc patient to establish with PCP for evaluation for possible reflux. Enc MVI 1 po daily. Enc to establish with/ follow up with PCP for primary care concerns, age appropriate screenings and illness.   3. Screening for STD (sexually transmitted disease) Await test results.  Counseled that RN will call if needs to RTC for treatment once results are back.  - Chlamydia/Gonorrhea [facility] - HIV/HCV [facility] - Syphilis Serology, [facility]  4. Nexplanon removal Nexplanon Removal Patient identified,  informed consent performed, consent signed.   Appropriate time out taken. Nexplanon site identified.  Area prepped in usual sterile fashon. 2 ml of 1% lidocaine with Epinephrine was used to anesthetize the area at the distal end of the implant and along implant site. A small stab incision was made right beside the implant on the distal portion.  The Nexplanon rod was grasped manually and removed without difficulty.  There was minimal blood loss. There were no complications.  Steri-strips were applied over the small incision.  A pressure bandage was applied to reduce any bruising.  The patient tolerated the procedure well and was given post procedure instructions.    Nexplanon:   Counseled patient to take OTC analgesic starting as soon as lidocaine starts to wear off and take regularly for at least 48 hr to decrease discomfort.  Specifically to take with food or milk to decrease stomach upset and for IB 600 mg (3 tablets) every 6 hrs; IB 800 mg (4 tablets) every 8 hrs; or Aleve 2 tablets every 12 hrs.    5. OCP (oral contraceptive pills) initiation Patient opts to restart OCP and Rx for  Sprintec 28d 1 po daily at the same time each day sent to patient's pharmacy of choice. Enc patient to use back up for 2 weeks of first cycle and if late OCP. - norgestimate-ethinyl estradiol (SPRINTEC 28) 0.25-35 MG-MCG tablet; Take 1 tablet by mouth daily.  Dispense: 28 tablet; Refill: 12  6. Anxiety LCSW referral made per patient request. Patient accepted LCSW and [employer] cards today. - Ambulatory referral to Behavioral Health     Return in about 1 year (around 03/15/2022) for RP and prn.  No future appointments.  Matt Holmes, PA

## 2021-03-20 ENCOUNTER — Emergency Department: Payer: BC Managed Care – PPO

## 2021-03-20 DIAGNOSIS — F17200 Nicotine dependence, unspecified, uncomplicated: Secondary | ICD-10-CM | POA: Insufficient documentation

## 2021-03-20 DIAGNOSIS — F419 Anxiety disorder, unspecified: Secondary | ICD-10-CM | POA: Insufficient documentation

## 2021-03-20 DIAGNOSIS — Z20822 Contact with and (suspected) exposure to covid-19: Secondary | ICD-10-CM | POA: Diagnosis not present

## 2021-03-20 DIAGNOSIS — R0789 Other chest pain: Secondary | ICD-10-CM | POA: Insufficient documentation

## 2021-03-20 DIAGNOSIS — R079 Chest pain, unspecified: Secondary | ICD-10-CM | POA: Diagnosis not present

## 2021-03-20 DIAGNOSIS — R072 Precordial pain: Secondary | ICD-10-CM | POA: Diagnosis not present

## 2021-03-20 LAB — CBC
HCT: 42.9 % (ref 36.0–46.0)
Hemoglobin: 14.5 g/dL (ref 12.0–15.0)
MCH: 30.9 pg (ref 26.0–34.0)
MCHC: 33.8 g/dL (ref 30.0–36.0)
MCV: 91.5 fL (ref 80.0–100.0)
Platelets: 373 10*3/uL (ref 150–400)
RBC: 4.69 MIL/uL (ref 3.87–5.11)
RDW: 11.9 % (ref 11.5–15.5)
WBC: 12.1 10*3/uL — ABNORMAL HIGH (ref 4.0–10.5)
nRBC: 0 % (ref 0.0–0.2)

## 2021-03-20 LAB — BASIC METABOLIC PANEL
Anion gap: 9 (ref 5–15)
BUN: 10 mg/dL (ref 6–20)
CO2: 24 mmol/L (ref 22–32)
Calcium: 9.5 mg/dL (ref 8.9–10.3)
Chloride: 108 mmol/L (ref 98–111)
Creatinine, Ser: 0.84 mg/dL (ref 0.44–1.00)
GFR, Estimated: >60 mL/min (ref >60)
Glucose, Bld: 92 mg/dL (ref 70–99)
Potassium: 3.7 mmol/L (ref 3.5–5.1)
Sodium: 141 mmol/L (ref 135–145)

## 2021-03-20 LAB — TROPONIN I (HIGH SENSITIVITY)
Troponin I (High Sensitivity): 4 ng/L (ref <18)
Troponin I (High Sensitivity): <2 ng/L (ref <18)

## 2021-03-20 LAB — POC URINE PREG, ED: Preg Test, Ur: NEGATIVE

## 2021-03-20 NOTE — ED Triage Notes (Signed)
Patient presents to ER with mid sternal chest pain for 1 week. Patient reports hx of similar episodes which usually resolve on their own. Patient also requesting blood sugar check and HIV screening. Patient ambulatory. Patient A&OX3.

## 2021-03-21 ENCOUNTER — Emergency Department
Admission: EM | Admit: 2021-03-21 | Discharge: 2021-03-21 | Disposition: A | Discharge status: Home / Self Care | Payer: BC Managed Care – PPO | Attending: Emergency Medicine | Admitting: Emergency Medicine

## 2021-03-21 DIAGNOSIS — F419 Anxiety disorder, unspecified: Secondary | ICD-10-CM

## 2021-03-21 DIAGNOSIS — R079 Chest pain, unspecified: Secondary | ICD-10-CM

## 2021-03-21 LAB — RESP PANEL BY RT-PCR (FLU A&B, COVID) ARPGX2
Influenza A by PCR: NEGATIVE
Influenza B by PCR: NEGATIVE
SARS Coronavirus 2 by RT PCR: NEGATIVE

## 2021-03-21 LAB — D-DIMER, QUANTITATIVE: D-Dimer, Quant: <0.27 ug/mL-FEU (ref 0.00–0.50)

## 2021-03-21 MED ORDER — LORAZEPAM 1 MG PO TABS: 1.0000 mg | ORAL_TABLET | Freq: Three times a day (TID) | ORAL | 0 refills | Status: DC | PRN

## 2021-03-21 MED ORDER — NAPROXEN 500 MG PO TABS: 500.0000 mg | ORAL_TABLET | Freq: Two times a day (BID) | ORAL | 0 refills | Status: DC

## 2021-03-21 MED ORDER — KETOROLAC TROMETHAMINE 30 MG/ML IJ SOLN
15.0000 mg | Freq: Once | INTRAMUSCULAR | Status: AC
  Administered 2021-03-21: 15 mg via INTRAVENOUS
  Filled 2021-03-21: qty 1

## 2021-03-21 MED ORDER — LORAZEPAM 2 MG/ML IJ SOLN
0.5000 mg | Freq: Once | INTRAMUSCULAR | Status: AC
  Administered 2021-03-21: 0.5 mg via INTRAVENOUS
  Filled 2021-03-21: qty 1

## 2021-03-21 NOTE — Discharge Instructions (Signed)
1.  Take Naprosyn 500 mg twice daily x7 days for chest discomfort. 2.  You may take Ativan every 8 hours as needed for anxiety. 3.  Apply moist heat to affected area several times daily. 4.  Return to the ER for worsening symptoms, persistent vomiting, difficulty breathing or other concerns.

## 2021-03-21 NOTE — ED Provider Notes (Signed)
[facility] Emergency Department Provider Note   ____________________________________________   Event Date/Time   First MD Initiated Contact with Patient 03/21/21 [phone]     (approximate)  I have reviewed the triage vital signs and the nursing notes.   HISTORY  Chief Complaint Chest Pain    HPI Nicole Leonard is a 25 y.o. female who presents to the ED from home with a chief complaint of chest pain.  Patient reports midsternal chest tightness x1 week, constant, nonradiating but states pain makes her back hurt.  Denies associated diaphoresis, shortness of breath, nausea/vomiting or palpitations.  Feels symptoms are secondary to anxiety because she is awaiting results of STD and HIV panels.  Also endorses generalized mild headache, myalgias.  Denies fever, cough, abdominal pain, dysuria or diarrhea.  Denies recent travel or trauma.  Recently had Nexplanon removed and prescribed OCPs which she has not yet started.     Past Medical History:  Diagnosis Date   Patient denies medical problems     There are no problems to display for this patient.   Past Surgical History:  Procedure Laterality Date   denies      Prior to Admission medications   Medication Sig Start Date End Date Taking? Authorizing Provider  LORazepam (ATIVAN) 1 MG tablet Take 1 tablet (1 mg total) by mouth every 8 (eight) hours as needed for anxiety. 03/21/21  Yes Irean Hong, MD  naproxen (NAPROSYN) 500 MG tablet Take 1 tablet (500 mg total) by mouth 2 (two) times daily with a meal. 03/21/21  Yes Irean Hong, MD  norgestimate-ethinyl estradiol (SPRINTEC 28) 0.25-35 MG-MCG tablet Take 1 tablet by mouth daily. 03/15/21   Matt Holmes, PA  ondansetron (ZOFRAN) 4 MG tablet Take 1 tablet (4 mg total) by mouth daily as needed for nausea or vomiting. Patient not taking: Reported on 03/15/2021 12/23/19   Emily Filbert, MD    Allergies Lavender oil and Penicillins  Family History   Problem Relation Age of Onset   Diabetes Maternal Grandmother    Diabetes Father    Diabetes Mother     Social History Social History   Tobacco Use   Smoking status: Every Day   Smokeless tobacco: Never  Vaping Use   Vaping Use: Never used  Substance Use Topics   Alcohol use: Yes    Comment: very rare   Drug use: Yes    Types: Marijuana    Comment: every day    Review of Systems  Constitutional: Positive for malaise.  No fever/chills Eyes: No visual changes. ENT: No sore throat. Cardiovascular: Positive for chest pain. Respiratory: Denies shortness of breath. Gastrointestinal: No abdominal pain.  No nausea, no vomiting.  No diarrhea.  No constipation. Genitourinary: Negative for dysuria. Musculoskeletal: Negative for back pain. Skin: Negative for rash. Neurological: Positive for headache.  Negative for focal weakness or numbness. Psychiatric: Positive for anxiety.  ____________________________________________   PHYSICAL EXAM:  VITAL SIGNS: ED Triage Vitals  Enc Vitals Group     BP 03/20/21 1929 112/75     Pulse Rate 03/20/21 1929 96     Resp 03/20/21 1929 18     Temp 03/20/21 1929 98.4 F (36.9 C)     Temp Source 03/20/21 1929 Oral     SpO2 03/20/21 1929 94 %     Weight 03/20/21 1932 265 lb (120.2 kg)     Height 03/20/21 1932 5\' 2"  (1.575 m)     Head Circumference --  Peak Flow --      Pain Score 03/20/21 1932 5     Pain Loc --      Pain Edu? --      Excl. in GC? --     Constitutional: Alert and oriented. Well appearing and in no acute distress. Eyes: Conjunctivae are normal. PERRL. EOMI. Head: Atraumatic. Nose: No congestion/rhinnorhea. Mouth/Throat: Mucous membranes are moist.   Neck: No stridor.   Cardiovascular: Normal rate, regular rhythm. Grossly normal heart sounds.  Good peripheral circulation. Respiratory: Normal respiratory effort.  No retractions. Lungs CTAB.  No wheezing, rhonchi or rales.  Anterior chest mildly tender to  palpation. Gastrointestinal: Obese.  Soft and nontender to light or deep palpation. No distention. No abdominal bruits. No CVA tenderness. Musculoskeletal: No lower extremity tenderness nor edema.  No joint effusions. Neurologic:  Normal speech and language. No gross focal neurologic deficits are appreciated. No gait instability. Skin:  Skin is warm, dry and intact. No rash noted. Psychiatric: Mood and affect are anxious. Speech and behavior are normal.  ____________________________________________   LABS (all labs ordered are listed, but only abnormal results are displayed)  Labs Reviewed  CBC - Abnormal; Notable for the following components:      Result Value   WBC 12.1 (*)    All other components within normal limits  RESP PANEL BY RT-PCR (FLU A&B, COVID) [id number]  BASIC METABOLIC PANEL  D-DIMER, QUANTITATIVE  POC URINE PREG, ED  TROPONIN I (HIGH SENSITIVITY)  TROPONIN I (HIGH SENSITIVITY)   ____________________________________________  EKG  ED ECG REPORT I, Jarrah Babich J, the attending physician, personally viewed and interpreted this ECG.   Date: 03/21/2021  EKG Time: 1933  Rate: 94  Rhythm: normal EKG, normal sinus rhythm  Axis: Normal  Intervals:none  ST&T Change: Nonspecific  ____________________________________________  RADIOLOGY I, Jazlynne Milliner J, personally viewed and evaluated these images (plain radiographs) as part of my medical decision making, as well as reviewing the written report by the radiologist.  ED MD interpretation: No acute cardiopulmonary process  Official radiology report(s): DG Chest 2 View  Result Date: 03/20/2021 CLINICAL DATA:  Midsternal chest pain x1 week EXAM: CHEST - 2 VIEW COMPARISON:  None. FINDINGS: The heart size and mediastinal contours are within normal limits. Both lungs are clear. The visualized skeletal structures are unremarkable. IMPRESSION: No active cardiopulmonary disease. Electronically Signed   By: Helyn Numbers MD   On:  03/20/2021 20:01    ____________________________________________   PROCEDURES  Procedure(s) performed (including Critical Care):  Procedures   ____________________________________________   INITIAL IMPRESSION / ASSESSMENT AND PLAN / ED COURSE  As part of my medical decision making, I reviewed the following data within the electronic MEDICAL RECORD NUMBER Nursing notes reviewed and incorporated, Labs reviewed, EKG interpreted, Old chart reviewed, Radiograph reviewed, and Notes from prior ED visits     25 year old female presenting with chest tightness and anxiety. Differential diagnosis includes, but is not limited to, ACS, aortic dissection, pulmonary embolism, cardiac tamponade, pneumothorax, pneumonia, pericarditis, myocarditis, GI-related causes including esophagitis/gastritis, and musculoskeletal chest wall pain.     Laboratory results unremarkable including 2 sets of negative troponins, chest x-ray and EKG.  Will check a D-dimer and respiratory panel.  IV Toradol administered for pain, IV Ativan administered for anxiety.  Patient has already had STD and HIV screens performed and she is awaiting her results.  Clinical Course as of 03/21/21 0449  Tue Mar 21, 2021  0345 Negative D-dimer.  Awaiting results of respiratory panel.  Patient  resting in no acute distress. [JS]  [id number] Updated patient on negative respiratory panel.  Will prescribe NSAIDs, limited quantity benzo for anxiety: Patient will follow up with her PCP.  Strict return precautions given.  Patient verbalizes understanding and agrees with plan of care. [JS]    Clinical Course User Index [JS] Irean Hong, MD     ____________________________________________   FINAL CLINICAL IMPRESSION(S) / ED DIAGNOSES  Final diagnoses:  Nonspecific chest pain  Anxiety     ED Discharge Orders          Ordered    LORazepam (ATIVAN) 1 MG tablet  Every 8 hours PRN        03/21/21 0424    naproxen (NAPROSYN) 500 MG tablet  2 times  daily with meals        03/21/21 0424             Note:  This document was prepared using Dragon voice recognition software and may include unintentional dictation errors.    Irean Hong, MD 03/21/21 [phone]

## 2021-03-22 ENCOUNTER — Telehealth: Payer: Self-pay | Admitting: Family Medicine

## 2021-08-16 ENCOUNTER — Ambulatory Visit (LOCAL_COMMUNITY_HEALTH_CENTER): Payer: Self-pay

## 2021-08-16 ENCOUNTER — Other Ambulatory Visit: Payer: Self-pay

## 2021-08-16 VITALS — BP 133/85 | Ht 64.0 in | Wt 278.0 lb

## 2021-08-16 DIAGNOSIS — Z3201 Encounter for pregnancy test, result positive: Secondary | ICD-10-CM

## 2021-08-16 LAB — PREGNANCY, URINE: Preg Test, Ur: POSITIVE — AB

## 2021-08-16 MED ORDER — PRENATAL 27-0.8 MG PO TABS: 1.0000 | ORAL_TABLET | Freq: Every day | ORAL | 0 refills | Status: AC

## 2021-08-16 NOTE — Progress Notes (Signed)
UPT positive. Plans prenatal care at [facility]. To DSS for medicaid/preg women. Jerel Shepherd, RN

## 2021-08-20 NOTE — L&D Delivery Note (Signed)
Delivery Note   Nicole Leonard is a 26 y.o. G1P0000 at [redacted]w[redacted]d Estimated Date of Delivery: 04/01/22 who presented to L&D for IOL for IUFD.  PRE-OPERATIVE DIAGNOSIS:  1) [redacted]w[redacted]d pregnancy.  IUFD  POST-OPERATIVE DIAGNOSIS:  1) [redacted]w[redacted]d pregnancy s/p NSVD of non-viable female infant  Delivery Type: Vaginal Delivery Anesthesia: None  Labor Complications: None  ESTIMATED BLOOD LOSS: 50 FINDINGS:    1) Non-viable female infant, Apgar scores of 0 and 0 Birth weight pending.   SPECIMENS:   PLACENTA:   Appearance: intact   Removal: spontaneous   Disposition: to pathology  CORD BLOOD: Collected/Not Indicated  DISPOSITION:  Infant to remain with family for now.   NARRATIVE SUMMARY: Labor course:  Nicole Leonard is a G1P0000 at [redacted]w[redacted]d who presented to Labor & Delivery for induction of labor for IUFD.  Labor proceeded with misoprostol, Foley bulb, and Pitocin augmentation. I was called to the room for an imminent delivery and the infant was found to be crowning. With excellent maternal pushing effort, Nicole Leonard birthed a non-viable female infant at 64. The shoulders were birthed without difficulty.  The cord was doubly clamped and cut by the father. The infant was swaddled and carried to the warmer. The placenta delivered spontaneously and was noted to be intact with a 3VC. A perineal and vaginal examination was performed. Episiotomy/Lacerations: none  The patient tolerated this well. Nicole Leonard was left in stable condition. The infant remains with the family for now.    Guadlupe Spanish, CNM 02/02/2022 8:03 AM

## 2021-09-07 ENCOUNTER — Other Ambulatory Visit: Payer: Self-pay

## 2021-09-07 ENCOUNTER — Ambulatory Visit (INDEPENDENT_AMBULATORY_CARE_PROVIDER_SITE_OTHER): Payer: Medicaid Other | Admitting: Obstetrics and Gynecology

## 2021-09-07 ENCOUNTER — Encounter: Payer: Self-pay | Admitting: Obstetrics and Gynecology

## 2021-09-07 VITALS — BP 124/80 | HR 87 | Temp 97.2°F | Ht 63.0 in | Wt 279.0 lb

## 2021-09-07 DIAGNOSIS — Z3200 Encounter for pregnancy test, result unknown: Secondary | ICD-10-CM | POA: Diagnosis not present

## 2021-09-07 DIAGNOSIS — Z6841 Body Mass Index (BMI) 40.0 and over, adult: Secondary | ICD-10-CM | POA: Diagnosis not present

## 2021-09-07 DIAGNOSIS — Z7689 Persons encountering health services in other specified circumstances: Secondary | ICD-10-CM | POA: Diagnosis not present

## 2021-09-07 NOTE — Progress Notes (Signed)
HPI:      Ms. Nicole Leonard is a 26 y.o. G1P0000 who LMP was Patient's last menstrual period was 07/03/2021 (exact date).  Subjective:   She presents today after having a positive pregnancy test noted at the [facility].  She is here to establish care at [facility].   She has had occasional nausea and vomiting but not often.  She is currently taking p.o. without difficulty. She is taking prenatal vitamins. This was not a planned pregnancy.  She had Nexplanon removed a few months ago and did not start other birth control.    Hx: The following portions of the patient's history were reviewed and updated as appropriate:             She  has a past medical history of Patient denies medical problems and PID (pelvic inflammatory disease). She does not have a problem list on file. She  has a past surgical history that includes denies. Her family history includes Diabetes in her father, maternal grandmother, and mother. She  reports that she quit smoking about 4 weeks ago. Her smoking use included cigarettes. She has never used smokeless tobacco. She reports that she does not currently use alcohol. She reports that she does not currently use drugs after having used the following drugs: Marijuana. She has a current medication list which includes the following prescription(s): multivitamin-prenatal. She is allergic to lavender oil and penicillins.       Review of Systems:  Review of Systems  Constitutional: Denied constitutional symptoms, night sweats, recent illness, fatigue, fever, insomnia and weight loss.  Eyes: Denied eye symptoms, eye pain, photophobia, vision change and visual disturbance.  Ears/Nose/Throat/Neck: Denied ear, nose, throat or neck symptoms, hearing loss, nasal discharge, sinus congestion and sore throat.  Cardiovascular: Denied cardiovascular symptoms, arrhythmia, chest pain/pressure, edema, exercise intolerance, orthopnea and palpitations.  Respiratory: Denied  pulmonary symptoms, asthma, pleuritic pain, productive sputum, cough, dyspnea and wheezing.  Gastrointestinal: Denied, gastro-esophageal reflux, melena, nausea and vomiting.  Genitourinary: Denied genitourinary symptoms including symptomatic vaginal discharge, pelvic relaxation issues, and urinary complaints.  Musculoskeletal: Denied musculoskeletal symptoms, stiffness, swelling, muscle weakness and myalgia.  Dermatologic: Denied dermatology symptoms, rash and scar.  Neurologic: Denied neurology symptoms, dizziness, headache, neck pain and syncope.  Psychiatric: Denied psychiatric symptoms, anxiety and depression.  Endocrine: Denied endocrine symptoms including hot flashes and night sweats.   Meds:   Current Outpatient Medications on File Prior to Visit  Medication Sig Dispense Refill   Prenatal Vit-Fe Fumarate-FA (MULTIVITAMIN-PRENATAL) 27-0.8 MG TABS tablet Take 1 tablet by mouth daily at 12 noon. 100 tablet 0   No current facility-administered medications on file prior to visit.      Objective:     There were no vitals filed for this visit. Filed Weights   09/07/21 0852  Weight: 279 lb (126.6 kg)                        Assessment:    G1P0000 There are no problems to display for this patient.    1. Encounter to establish care   2. Possible pregnancy, not confirmed   3. Morbid obesity with BMI of 45.0-49.9, adult (HCC)     Approximately 9 weeks estimated gestational age based on LMP.   Plan:            Prenatal Plan 1.  The patient was given prenatal literature. 2.  She was continued on prenatal vitamins. 3.  A prenatal  lab panel to be drawn at nurse visit. 4.  An ultrasound was ordered to better determine an EDC. 5.  A nurse visit was scheduled. 6.  Genetic testing and testing for other inheritable conditions discussed in detail. She will decide in the future whether to have these labs performed. 7.  A general overview of pregnancy testing, visit schedule,  ultrasound schedule, and prenatal care was discussed. 8.  COVID and its risks associated with pregnancy, prevention by limiting exposure and use of masks, as well as the risks and benefits of vaccination during pregnancy were discussed in detail.  [facility] policy regarding office and hospital visitation and testing was explained. 9.  Benefits of breast-feeding discussed in detail including both maternal and infant benefits. Ready Set Baby website discussed. 10.  Plan ASA at 13 weeks 11.  Early anesthesia consult for elevated BMI. 12.  Plan early 1 hour GCT 16 to 18 weeks 13.  Nausea and vomiting of pregnancy discussed  Orders Orders Placed This Encounter  Procedures   US OB Comp Less 14 Wks    No orders of the defined types were placed in this encounter.     F/U  Return in about 4 weeks (around 10/05/2021). I spent 34 minutes involved in the care of this patient preparing to see the patient by obtaining and reviewing her medical history (including labs, imaging tests and prior procedures), documenting clinical information in the electronic health record (EHR), counseling and coordinating care plans, writing and sending prescriptions, ordering tests or procedures and in direct communicating with the patient and medical staff discussing pertinent items from her history and physical exam.  Finis Bud, M.D. 09/07/2021 9:22 AM

## 2021-09-20 ENCOUNTER — Other Ambulatory Visit: Payer: Self-pay

## 2021-09-20 ENCOUNTER — Ambulatory Visit (INDEPENDENT_AMBULATORY_CARE_PROVIDER_SITE_OTHER): Payer: Medicaid Other

## 2021-09-20 DIAGNOSIS — Z3200 Encounter for pregnancy test, result unknown: Secondary | ICD-10-CM | POA: Diagnosis not present

## 2021-09-22 ENCOUNTER — Ambulatory Visit (INDEPENDENT_AMBULATORY_CARE_PROVIDER_SITE_OTHER): Payer: Medicaid Other | Admitting: Obstetrics and Gynecology

## 2021-09-22 ENCOUNTER — Other Ambulatory Visit: Payer: Self-pay

## 2021-09-22 VITALS — BP 119/84 | HR 101 | Wt 289.0 lb

## 2021-09-22 DIAGNOSIS — Z1379 Encounter for other screening for genetic and chromosomal anomalies: Secondary | ICD-10-CM

## 2021-09-22 DIAGNOSIS — O99210 Obesity complicating pregnancy, unspecified trimester: Secondary | ICD-10-CM

## 2021-09-22 DIAGNOSIS — Z3401 Encounter for supervision of normal first pregnancy, first trimester: Secondary | ICD-10-CM | POA: Diagnosis not present

## 2021-09-22 DIAGNOSIS — Z0283 Encounter for blood-alcohol and blood-drug test: Secondary | ICD-10-CM | POA: Diagnosis not present

## 2021-09-22 DIAGNOSIS — Z113 Encounter for screening for infections with a predominantly sexual mode of transmission: Secondary | ICD-10-CM

## 2021-09-22 DIAGNOSIS — Z3A11 11 weeks gestation of pregnancy: Secondary | ICD-10-CM

## 2021-09-22 NOTE — Progress Notes (Signed)
Nicole Leonard presents for NOB nurse interview visit. Pregnancy confirmation done 08/16/2021.  G1. Pregnancy education material explained and given. NOB labs ordered. (TSH/HbgA1c due to Increased BMI. Body mass index is 51.19 kg/m. (sickle cell). HIV labs and Drug screen were explained optional and she did not decline. Drug screen ordered. PNV encouraged. Genetic screening options discussed. Genetic testing: [id number]. Pt to discuss with provider.  Financial policy reviewed. FMLA from reviewed and signed. Pt. To follow up with provider in 1-2 weeks for NOB physical.  All questions answered.

## 2021-09-22 NOTE — Progress Notes (Signed)
Nicole Leonard presents for NOB nurse interview visit. Pregnancy confirmation done 08/16/2021.  G1. Pregnancy education material explained and given. NOB labs ordered. (TSH/HbgA1c due to Increased BMI. Body mass index is 51.19 kg/m². (sickle cell). HIV labs and Drug screen were explained optional and she did not decline. Drug screen ordered. PNV encouraged. Genetic screening options discussed. Genetic testing: [email or web address]. Pt to discuss with provider.  Financial policy reviewed. FMLA from reviewed and signed. Pt. To follow up with provider in 1-2 weeks for NOB physical.  All questions answered. °

## 2021-09-23 LAB — URINALYSIS, ROUTINE W REFLEX MICROSCOPIC
Bilirubin, UA: NEGATIVE
Glucose, UA: NEGATIVE
Ketones, UA: NEGATIVE
Leukocytes,UA: NEGATIVE
Nitrite, UA: NEGATIVE
Protein,UA: NEGATIVE
RBC, UA: NEGATIVE
Specific Gravity, UA: >=1.03 — AB (ref 1.005–1.030)
Urobilinogen, Ur: 0.2 mg/dL (ref 0.2–1.0)
pH, UA: 6.5 (ref 5.0–7.5)

## 2021-09-24 LAB — TSH: TSH: 2.18 u[IU]/mL (ref 0.450–4.500)

## 2021-09-24 LAB — VIRAL HEPATITIS HBV, HCV
HCV Ab: <0.1 s/co ratio (ref 0.0–0.9)
Hep B Core Total Ab: NEGATIVE
Hep B Surface Ab, Qual: REACTIVE
Hepatitis B Surface Ag: NEGATIVE

## 2021-09-24 LAB — HEMOGLOBIN A1C
Est. average glucose Bld gHb Est-mCnc: 103 mg/dL
Hgb A1c MFr Bld: 5.2 % (ref 4.8–5.6)

## 2021-09-24 LAB — CULTURE, OB URINE

## 2021-09-24 LAB — URINE CULTURE, OB REFLEX

## 2021-09-24 LAB — ANTIBODY SCREEN: Antibody Screen: NEGATIVE

## 2021-09-24 LAB — RPR: RPR Ser Ql: NONREACTIVE

## 2021-09-24 LAB — ABO AND RH: Rh Factor: POSITIVE

## 2021-09-24 LAB — HCV INTERPRETATION

## 2021-09-24 LAB — RUBELLA SCREEN: Rubella Antibodies, IGG: 2.16 index (ref >0.99)

## 2021-09-24 LAB — TOXOPLASMA ANTIBODIES- IGG AND  IGM
Toxoplasma Antibody- IgM: <3 AU/mL (ref 0.0–7.9)
Toxoplasma IgG Ratio: <3 IU/mL (ref 0.0–7.1)

## 2021-09-24 LAB — HGB SOLU + RFLX FRAC: Sickle Solubility Test - HGBRFX: NEGATIVE

## 2021-09-24 LAB — HIV ANTIBODY (ROUTINE TESTING W REFLEX): HIV Screen 4th Generation wRfx: NONREACTIVE

## 2021-09-24 LAB — VARICELLA ZOSTER ANTIBODY, IGG: Varicella zoster IgG: 489 index (ref >165)

## 2021-09-25 LAB — GC/CHLAMYDIA PROBE AMP
Chlamydia trachomatis, NAA: NEGATIVE
Neisseria Gonorrhoeae by PCR: NEGATIVE

## 2021-09-27 ENCOUNTER — Encounter: Payer: Self-pay | Admitting: Obstetrics and Gynecology

## 2021-09-28 LAB — MONITOR DRUG PROFILE 14(MW)
Amphetamine Scrn, Ur: NEGATIVE ng/mL
BARBITURATE SCREEN URINE: NEGATIVE ng/mL
BENZODIAZEPINE SCREEN, URINE: NEGATIVE ng/mL
Buprenorphine, Urine: NEGATIVE ng/mL
Cocaine (Metab) Scrn, Ur: NEGATIVE ng/mL
Creatinine(Crt), U: 161.1 mg/dL (ref 20.0–300.0)
Fentanyl, Urine: NEGATIVE pg/mL
Meperidine Screen, Urine: NEGATIVE ng/mL
Methadone Screen, Urine: NEGATIVE ng/mL
OXYCODONE+OXYMORPHONE UR QL SCN: NEGATIVE ng/mL
Opiate Scrn, Ur: NEGATIVE ng/mL
Ph of Urine: 6.5 (ref 4.5–8.9)
Phencyclidine Qn, Ur: NEGATIVE ng/mL
Propoxyphene Scrn, Ur: NEGATIVE ng/mL
SPECIFIC GRAVITY: 1.028
Tramadol Screen, Urine: NEGATIVE ng/mL

## 2021-09-28 LAB — CANNABINOID (GC/MS), URINE
Cannabinoid: POSITIVE — AB
Carboxy THC (GC/MS): 164 ng/mL

## 2021-09-28 LAB — NICOTINE SCREEN, URINE: Cotinine Ql Scrn, Ur: NEGATIVE ng/mL

## 2021-09-29 LAB — MATERNIT21 PLUS CORE+SCA
Fetal Fraction: 6
Monosomy X (Turner Syndrome): NOT DETECTED
Result (T21): NEGATIVE
Trisomy 13 (Patau syndrome): NEGATIVE
Trisomy 18 (Edwards syndrome): NEGATIVE
Trisomy 21 (Down syndrome): NEGATIVE
XXX (Triple X Syndrome): NOT DETECTED
XXY (Klinefelter Syndrome): NOT DETECTED
XYY (Jacobs Syndrome): NOT DETECTED

## 2021-10-03 ENCOUNTER — Other Ambulatory Visit: Payer: Self-pay

## 2021-10-03 ENCOUNTER — Other Ambulatory Visit (HOSPITAL_COMMUNITY)
Admission: RE | Admit: 2021-10-03 | Discharge: 2021-10-03 | Disposition: A | Discharge status: Home / Self Care | Payer: BC Managed Care – PPO | Source: Ambulatory Visit | Attending: Obstetrics and Gynecology | Admitting: Obstetrics and Gynecology

## 2021-10-03 ENCOUNTER — Encounter: Payer: Self-pay | Admitting: Obstetrics and Gynecology

## 2021-10-03 ENCOUNTER — Ambulatory Visit (INDEPENDENT_AMBULATORY_CARE_PROVIDER_SITE_OTHER): Payer: Medicaid Other | Admitting: Obstetrics and Gynecology

## 2021-10-03 VITALS — BP 139/81 | HR 94 | Wt 286.4 lb

## 2021-10-03 DIAGNOSIS — Z124 Encounter for screening for malignant neoplasm of cervix: Secondary | ICD-10-CM | POA: Insufficient documentation

## 2021-10-03 DIAGNOSIS — Z1379 Encounter for other screening for genetic and chromosomal anomalies: Secondary | ICD-10-CM

## 2021-10-03 DIAGNOSIS — Z3A14 14 weeks gestation of pregnancy: Secondary | ICD-10-CM

## 2021-10-03 DIAGNOSIS — Z3402 Encounter for supervision of normal first pregnancy, second trimester: Secondary | ICD-10-CM

## 2021-10-03 LAB — POCT URINALYSIS DIPSTICK OB
Bilirubin, UA: NEGATIVE
Blood, UA: NEGATIVE
Glucose, UA: NEGATIVE
Ketones, UA: NEGATIVE
Leukocytes, UA: NEGATIVE
Nitrite, UA: NEGATIVE
POC,PROTEIN,UA: NEGATIVE
Spec Grav, UA: 1.02 (ref 1.010–1.025)
Urobilinogen, UA: 0.2 E.U./dL
pH, UA: 6.5 (ref 5.0–8.0)

## 2021-10-03 NOTE — Progress Notes (Signed)
Patient presents today for NOB physical. Patient states feeling occasional cramping no more than once a day. Patient is due for a pap smear which has been ordered. Patient states no questions or concerns at this time.

## 2021-10-03 NOTE — Progress Notes (Signed)
NOB: She has no complaints.  She is taking prenatal vitamins as directed.  She presents today for her new OB.  Pap smear performed.  Anesthesia consult for BMI scheduled.  Patient will begin ASA 81.  aFP next visit.  Physical examination General NAD, Conversant  HEENT Atraumatic; Op clear with mmm.  Normo-cephalic. Pupils reactive. Anicteric sclerae  Thyroid/Neck Smooth without nodularity or enlargement. Normal ROM.  Neck Supple.  Skin No rashes, lesions or ulceration. Normal palpated skin turgor. No nodularity.  Breasts: No masses or discharge.  Symmetric.  No axillary adenopathy.  Lungs: Clear to auscultation.No rales or wheezes. Normal Respiratory effort, no retractions.  Heart: NSR.  No murmurs or rubs appreciated. No periferal edema  Abdomen: Soft.  Non-tender.  No masses.  No HSM. No hernia  Extremities: Moves all appropriately.  Normal ROM for age. No lymphadenopathy.  Neuro: Oriented to PPT.  Normal mood. Normal affect.     Pelvic:   Vulva: Normal appearance.  No lesions.  Vagina: No lesions or abnormalities noted.  Support: Normal pelvic support.  Urethra No masses tenderness or scarring.  Meatus Normal size without lesions or prolapse.  Cervix: Normal appearance.  No lesions.  Anus: Normal exam.  No lesions.  Perineum: Normal exam.  No lesions.        Bimanual   Adnexae: No masses.  Non-tender to palpation.  Uterus: Enlarged.  161 bpm non-tender.  Mobile.  AV.  Adnexae: No masses.  Non-tender to palpation.  Cul-de-sac: Negative for abnormality.  Adnexae: No masses.  Non-tender to palpation.         Pelvimetry   Diagonal: Reached.  Spines: Average.  Sacrum: Concave.  Pubic Arch: Normal.

## 2021-10-04 LAB — CYTOLOGY - PAP
Chlamydia: NEGATIVE
Comment: NEGATIVE
Comment: NORMAL
Diagnosis: NEGATIVE
Neisseria Gonorrhea: NEGATIVE

## 2021-10-05 ENCOUNTER — Encounter: Payer: Self-pay | Admitting: Obstetrics and Gynecology

## 2021-10-05 ENCOUNTER — Telehealth: Payer: Self-pay | Admitting: Obstetrics and Gynecology

## 2021-10-05 NOTE — Telephone Encounter (Signed)
Pt called wanting to speak with clinical staff about recent test results, requesting a call back.

## 2021-10-05 NOTE — Telephone Encounter (Signed)
Spoke with patient regarding recent labs and pap smear. Patients concern was suggestive BV. Patient states no odor, no discharge or any symptoms. Patient was offered in office samples of boric acid suppositories and instructed of OTC options, patient declined at this time.

## 2021-10-11 ENCOUNTER — Other Ambulatory Visit: Payer: Self-pay

## 2021-10-11 ENCOUNTER — Encounter
Admission: RE | Admit: 2021-10-11 | Discharge: 2021-10-11 | Disposition: A | Discharge status: Home / Self Care | Payer: BC Managed Care – PPO | Source: Ambulatory Visit | Attending: Anesthesiology | Admitting: Anesthesiology

## 2021-10-11 NOTE — Consult Note (Signed)
Anesthesiology consult note ( Ambulatory referral to OB anesthesia for morbid obesity) :  I had the distinct pleasure of meeting Nicole Leonard today for an OB anesthesia precheck.  She has a history of morbid obesity with a BMI today of 50.  Her EDD is 04/01/22.  Patient reports no previous problems with anesthesia, no problems with her back and has a MP score of 3.  We discussed the risks of Obesity in Pregnancy including but not limited to: Patients with a BMI > 40 are at increased risk for complications during pregnancy, such as hypertensive disorders of pregnancy, gestational diabetes, obstructive sleep apnea, thromboembolic disease, prolonged labor, operative vaginal delivery and need for cesarean delivery. There is an increased risk of airway complications, including inability to place a breathing tube, should an emergency cesarean delivery be required. There is an increased risk of aspiration, where stomach contents get into the lungs and can cause inflammation, infection and even respiratory failure. Epidural placement is more difficult in obesity, often requires multiple attempts, more often results in inadequate pain relief, more often requires replacement due to movement of the epidural catheter and more often results in accidental dural puncture and post dural puncture headache.  I explained to the patient that we would follow our Obesity in Pregnancy Protocol: An anesthesiology consultation is recommended for all patients with a pre-pregnancy BMI ? 45. During this consultation the anesthesiologist will ask you questions about your medical history as well as do a physical exam that specifically looks for features that are predictive of complications of anesthesia.  The anesthesiologist will also review with you potential complications of anesthesia that are specifically increased due to obesity during pregnancy.  If your BMI ? 49 at your 34 week appointment you will have to be evaluated by an  anesthesiologist prior to 36 weeks. This evaluation will specifically determine whether you are at high risk for complications of anesthesia. If you are found to be at high risk for complications of anesthesia your OB provider will be directed to transfer your care to an OB provider at a hospital that has a higher maternal level of care designation  Patient voiced understanding and signed our Obesity in Pregnancy form acknowledging that we had discussed her pathway forward.

## 2021-10-11 NOTE — Telephone Encounter (Signed)
Patient called in to ask for OTC treatment options. Advised Boric acid vaginal suppositories would help. Patient voiced understanding

## 2021-10-12 NOTE — Telephone Encounter (Signed)
Patient was called and advised not take boric acid suppositories. Patient states she has not taken any and is nonsymptomatic.

## 2021-10-23 ENCOUNTER — Telehealth: Payer: Self-pay | Admitting: Obstetrics and Gynecology

## 2021-10-23 NOTE — Telephone Encounter (Signed)
Patient called and stated that she need medical clearance from Dr.Evans in order to be seen at the dentist office. Please advise.  ?

## 2021-10-23 NOTE — Telephone Encounter (Signed)
Dental clearance letter sent to patient via mychart

## 2021-10-31 ENCOUNTER — Encounter: Payer: Self-pay | Admitting: Obstetrics and Gynecology

## 2021-10-31 ENCOUNTER — Other Ambulatory Visit: Payer: Self-pay

## 2021-10-31 ENCOUNTER — Ambulatory Visit (INDEPENDENT_AMBULATORY_CARE_PROVIDER_SITE_OTHER): Payer: Medicaid Other | Admitting: Obstetrics and Gynecology

## 2021-10-31 VITALS — BP 135/69 | HR 102 | Wt 295.6 lb

## 2021-10-31 DIAGNOSIS — Z1379 Encounter for other screening for genetic and chromosomal anomalies: Secondary | ICD-10-CM

## 2021-10-31 DIAGNOSIS — R030 Elevated blood-pressure reading, without diagnosis of hypertension: Secondary | ICD-10-CM

## 2021-10-31 DIAGNOSIS — O0992 Supervision of high risk pregnancy, unspecified, second trimester: Secondary | ICD-10-CM

## 2021-10-31 DIAGNOSIS — O99210 Obesity complicating pregnancy, unspecified trimester: Secondary | ICD-10-CM

## 2021-10-31 DIAGNOSIS — Z3A18 18 weeks gestation of pregnancy: Secondary | ICD-10-CM

## 2021-10-31 DIAGNOSIS — Z3402 Encounter for supervision of normal first pregnancy, second trimester: Secondary | ICD-10-CM

## 2021-10-31 DIAGNOSIS — B9689 Other specified bacterial agents as the cause of diseases classified elsewhere: Secondary | ICD-10-CM

## 2021-10-31 DIAGNOSIS — N760 Acute vaginitis: Secondary | ICD-10-CM

## 2021-10-31 LAB — POCT URINALYSIS DIPSTICK OB
Bilirubin, UA: NEGATIVE
Blood, UA: NEGATIVE
Glucose, UA: NEGATIVE
Ketones, UA: NEGATIVE
Leukocytes, UA: NEGATIVE
Nitrite, UA: NEGATIVE
Spec Grav, UA: 1.02 (ref 1.010–1.025)
Urobilinogen, UA: 0.2 E.U./dL
pH, UA: 6.5 (ref 5.0–8.0)

## 2021-10-31 MED ORDER — METRONIDAZOLE 500 MG PO TABS: 500.0000 mg | ORAL_TABLET | Freq: Two times a day (BID) | ORAL | 0 refills | Status: DC

## 2021-10-31 NOTE — Progress Notes (Signed)
ROB: She was positive for BV at her previous visit. She tried the tea tree oil, but continues to have odor. She would like a prescription today. AFP today.  ?

## 2021-10-31 NOTE — Patient Instructions (Signed)
WHAT OB PATIENTS CAN EXPECT ° °Confirmation of pregnancy and ultrasound ordered if medically indicated-[redacted] weeks gestation °New OB (NOB) intake with nurse and New OB (NOB) labs- [redacted] weeks gestation °New OB (NOB) physical examination with provider- 11/[redacted] weeks gestation °Flu vaccine-[redacted] weeks gestation °Anatomy scan-[redacted] weeks gestation °Glucose tolerance test, blood work to test for anemia, T-dap vaccine-[redacted] weeks gestation °Vaginal swabs/cultures-STD/Group B strep-[redacted] weeks gestation °Appointments every 4 weeks until 28 weeks °Every 2 weeks from 28 weeks until 36 weeks °Weekly visits from 36 weeks until delivery ° ° ° °Second Trimester of Pregnancy °The second trimester of pregnancy is from week 13 through week 27. This is months 4 through 6 of pregnancy. The second trimester is often a time when you feel your best. Your body has adjusted to being pregnant, and you begin to feel better physically. °During the second trimester: °Morning sickness has lessened or stopped completely. °You may have more energy. °You may have an increase in appetite. °The second trimester is also a time when the unborn baby (fetus) is growing rapidly. At the end of the sixth month, the fetus may be up to 12 inches long and weigh about 1½ pounds. You will likely begin to feel the baby move (quickening) between 16 and 20 weeks of pregnancy. °Body changes during your second trimester °Your body continues to go through many changes during your second trimester. The changes vary and generally return to normal after the baby is born. °Physical changes °Your weight will continue to increase. You will notice your lower abdomen bulging out. °You may begin to get stretch marks on your hips, abdomen, and breasts. °Your breasts will continue to grow and to become tender. °Dark spots or blotches (chloasma or mask of pregnancy) may develop on your face. °A dark line from your belly button to the pubic area (linea nigra) may appear. °You may have changes in  your hair. These can include thickening of your hair, rapid growth, and changes in texture. Some people also have hair loss during or after pregnancy, or hair that feels dry or thin. °Health changes °You may develop headaches. °You may have heartburn. °You may develop constipation. °You may develop hemorrhoids or swollen, bulging veins (varicose veins). °Your gums may bleed and may be sensitive to brushing and flossing. °You may urinate more often because the fetus is pressing on your bladder. °You may have back pain. This is caused by: °Weight gain. °Pregnancy hormones that are relaxing the joints in your pelvis. °A shift in weight and the muscles that support your balance. °Follow these instructions at home: °Medicines °Follow your health care provider's instructions regarding medicine use. Specific medicines may be either safe or unsafe to take during pregnancy. Do not take any medicines unless approved by your health care provider. °Take a prenatal vitamin that contains at least 600 micrograms (mcg) of folic acid. °Eating and drinking °Eat a healthy diet that includes fresh fruits and vegetables, whole grains, good sources of protein such as meat, eggs, or tofu, and low-fat dairy products. °Avoid raw meat and unpasteurized juice, milk, and cheese. These carry germs that can harm you and your baby. °You may need to take these actions to prevent or treat constipation: °Drink enough fluid to keep your urine pale yellow. °Eat foods that are high in fiber, such as beans, whole grains, and fresh fruits and vegetables. °Limit foods that are high in fat and processed sugars, such as fried or sweet foods. °Activity °Exercise only as directed by your   health care provider. Most people can continue their usual exercise routine during pregnancy. Try to exercise for 30 minutes at least 5 days a week. Stop exercising if you develop contractions in your uterus. °Stop exercising if you develop pain or cramping in the lower  abdomen or lower back. °Avoid exercising if it is very hot or humid or if you are at a high altitude. °Avoid heavy lifting. °If you choose to, you may have sex unless your health care provider tells you not to. °Relieving pain and discomfort °Wear a supportive bra to prevent discomfort from breast tenderness. °Take warm sitz baths to soothe any pain or discomfort caused by hemorrhoids. Use hemorrhoid cream if your health care provider approves. °Rest with your legs raised (elevated) if you have leg cramps or low back pain. °If you develop varicose veins: °Wear support hose as told by your health care provider. °Elevate your feet for 15 minutes, 3-4 times a day. °Limit salt in your diet. °Safety °Wear your seat belt at all times when driving or riding in a car. °Talk with your health care provider if someone is verbally or physically abusive to you. °Lifestyle °Do not use hot tubs, steam rooms, or saunas. °Do not douche. Do not use tampons or scented sanitary pads. °Avoid cat litter boxes and soil used by cats. These carry germs that can cause birth defects in the baby and possibly loss of the fetus by miscarriage or stillbirth. °Do not use herbal remedies, alcohol, illegal drugs, or medicines that are not approved by your health care provider. Chemicals in these products can harm your baby. °Do not use any products that contain nicotine or tobacco, such as cigarettes, e-cigarettes, and chewing tobacco. If you need help quitting, ask your health care provider. °General instructions °During a routine prenatal visit, your health care provider will do a physical exam and other tests. He or she will also discuss your overall health. Keep all follow-up visits. This is important. °Ask your health care provider for a referral to a local prenatal education class. °Ask for help if you have counseling or nutritional needs during pregnancy. Your health care provider can offer advice or refer you to specialists for help with  various needs. °Where to find more information °American Pregnancy Association: americanpregnancy.org °American College of Obstetricians and Gynecologists: acog.org/en/Womens%20Health/Pregnancy °Office on Women's Health: womenshealth.gov/pregnancy °Contact a health care provider if you have: °A headache that does not go away when you take medicine. °Vision changes or you see spots in front of your eyes. °Mild pelvic cramps, pelvic pressure, or nagging pain in the abdominal area. °Persistent nausea, vomiting, or diarrhea. °A bad-smelling vaginal discharge or foul-smelling urine. °Pain when you urinate. °Sudden or extreme swelling of your face, hands, ankles, feet, or legs. °A fever. °Get help right away if you: °Have fluid leaking from your vagina. °Have spotting or bleeding from your vagina. °Have severe abdominal cramping or pain. °Have difficulty breathing. °Have chest pain. °Have fainting spells. °Have not felt your baby move for the time period told by your health care provider. °Have new or increased pain, swelling, or redness in an arm or leg. °Summary °The second trimester of pregnancy is from week 13 through week 27 (months 4 through 6). °Do not use herbal remedies, alcohol, illegal drugs, or medicines that are not approved by your health care provider. Chemicals in these products can harm your baby. °Exercise only as directed by your health care provider. Most people can continue their usual exercise routine during   pregnancy. °Keep all follow-up visits. This is important. °This information is not intended to replace advice given to you by your health care provider. Make sure you discuss any questions you have with your health care provider. °Document Revised: 01/13/2020 Document Reviewed: 11/19/2019 °Elsevier Patient Education © 2022 [employer] ° °

## 2021-10-31 NOTE — Progress Notes (Signed)
ROB: Patient still noting BV symptoms, attempted to use tea tree oil but did not help. Will prescribe Flagyl.  Notes breast pain on right, intermittent. Normal MaterniT21, for AFP today  Anatomy scan ordered. Reviewed vitals, noted last 2 BPs have been borderline. Discussed risk of developing HTN in pregnancy. Is taking ASA.  RTC in 4 weeks.  ?

## 2021-11-02 LAB — AFP, SERUM, OPEN SPINA BIFIDA
AFP MoM: 2.56
AFP Value: 87.9 ng/mL
Gest. Age on Collection Date: 18.2 weeks
Maternal Age At EDD: 26.4 yr
OSBR Risk 1 IN: 513
Test Results:: NEGATIVE
Weight: 296 [lb_av]

## 2021-11-28 ENCOUNTER — Ambulatory Visit (INDEPENDENT_AMBULATORY_CARE_PROVIDER_SITE_OTHER): Payer: Medicaid Other

## 2021-11-28 ENCOUNTER — Ambulatory Visit (INDEPENDENT_AMBULATORY_CARE_PROVIDER_SITE_OTHER): Payer: Medicaid Other | Admitting: Obstetrics and Gynecology

## 2021-11-28 ENCOUNTER — Encounter: Payer: Self-pay | Admitting: Obstetrics and Gynecology

## 2021-11-28 VITALS — BP 127/84 | HR 96 | Wt 308.8 lb

## 2021-11-28 DIAGNOSIS — Z3A22 22 weeks gestation of pregnancy: Secondary | ICD-10-CM

## 2021-11-28 DIAGNOSIS — O0992 Supervision of high risk pregnancy, unspecified, second trimester: Secondary | ICD-10-CM

## 2021-11-28 LAB — POCT URINALYSIS DIPSTICK OB
Bilirubin, UA: NEGATIVE
Blood, UA: NEGATIVE
Glucose, UA: NEGATIVE
Ketones, UA: NEGATIVE
Leukocytes, UA: NEGATIVE
Nitrite, UA: NEGATIVE
POC,PROTEIN,UA: NEGATIVE
Spec Grav, UA: 1.015 (ref 1.010–1.025)
Urobilinogen, UA: 0.2 E.U./dL
pH, UA: 6.5 (ref 5.0–8.0)

## 2021-11-28 NOTE — Progress Notes (Signed)
ROB: Had anatomy ultrasound today.  Patient reports occasional nosebleeds.  We discussed use of vaporizer/humidifier at night.  If nosebleeds become more frequent consider discontinuation of aspirin.  Patient reports fetal movement.  1 hour GCT next visit. ?

## 2021-11-28 NOTE — Progress Notes (Signed)
ROB. Patient states fetal movement and back pain. She states she has been experiencing nose bleeds more often.Ultrasound today showed patient is having a baby boy! Patient states no questions or concerns at this time.   ?

## 2021-12-18 ENCOUNTER — Observation Stay
Admission: EM | Admit: 2021-12-18 | Discharge: 2021-12-18 | Disposition: A | Discharge status: Home / Self Care | Payer: BC Managed Care – PPO | Attending: Obstetrics and Gynecology | Admitting: Obstetrics and Gynecology

## 2021-12-18 ENCOUNTER — Other Ambulatory Visit: Payer: Self-pay

## 2021-12-18 ENCOUNTER — Telehealth: Payer: Self-pay | Admitting: Obstetrics and Gynecology

## 2021-12-18 ENCOUNTER — Encounter: Payer: Self-pay | Admitting: Obstetrics and Gynecology

## 2021-12-18 DIAGNOSIS — Z3A25 25 weeks gestation of pregnancy: Secondary | ICD-10-CM | POA: Insufficient documentation

## 2021-12-18 DIAGNOSIS — O99332 Smoking (tobacco) complicating pregnancy, second trimester: Secondary | ICD-10-CM | POA: Diagnosis not present

## 2021-12-18 DIAGNOSIS — O0990 Supervision of high risk pregnancy, unspecified, unspecified trimester: Secondary | ICD-10-CM

## 2021-12-18 DIAGNOSIS — F17210 Nicotine dependence, cigarettes, uncomplicated: Secondary | ICD-10-CM | POA: Insufficient documentation

## 2021-12-18 DIAGNOSIS — Z7982 Long term (current) use of aspirin: Secondary | ICD-10-CM | POA: Diagnosis not present

## 2021-12-18 DIAGNOSIS — O1202 Gestational edema, second trimester: Principal | ICD-10-CM | POA: Diagnosis present

## 2021-12-18 DIAGNOSIS — O0992 Supervision of high risk pregnancy, unspecified, second trimester: Secondary | ICD-10-CM | POA: Diagnosis not present

## 2021-12-18 LAB — COMPREHENSIVE METABOLIC PANEL
ALT: 18 U/L (ref 0–44)
AST: 15 U/L (ref 15–41)
Albumin: 3.1 g/dL — ABNORMAL LOW (ref 3.5–5.0)
Alkaline Phosphatase: 64 U/L (ref 38–126)
Anion gap: 8 (ref 5–15)
BUN: 6 mg/dL (ref 6–20)
CO2: 22 mmol/L (ref 22–32)
Calcium: 9 mg/dL (ref 8.9–10.3)
Chloride: 104 mmol/L (ref 98–111)
Creatinine, Ser: 0.64 mg/dL (ref 0.44–1.00)
GFR, Estimated: >60 mL/min (ref >60)
Glucose, Bld: 85 mg/dL (ref 70–99)
Potassium: 3.9 mmol/L (ref 3.5–5.1)
Sodium: 134 mmol/L — ABNORMAL LOW (ref 135–145)
Total Bilirubin: 0.5 mg/dL (ref 0.3–1.2)
Total Protein: 6.7 g/dL (ref 6.5–8.1)

## 2021-12-18 LAB — CBC
HCT: 39.2 % (ref 36.0–46.0)
Hemoglobin: 12.9 g/dL (ref 12.0–15.0)
MCH: 29.9 pg (ref 26.0–34.0)
MCHC: 32.9 g/dL (ref 30.0–36.0)
MCV: 91 fL (ref 80.0–100.0)
Platelets: 383 10*3/uL (ref 150–400)
RBC: 4.31 MIL/uL (ref 3.87–5.11)
RDW: 12.9 % (ref 11.5–15.5)
WBC: 12.4 10*3/uL — ABNORMAL HIGH (ref 4.0–10.5)
nRBC: 0 % (ref 0.0–0.2)

## 2021-12-18 LAB — PROTEIN / CREATININE RATIO, URINE
Creatinine, Urine: 138 mg/dL
Protein Creatinine Ratio: 0.08 mg/mg{Cre} (ref 0.00–0.15)
Total Protein, Urine: 11 mg/dL

## 2021-12-18 NOTE — OB Triage Note (Signed)
Patient presented to L&D for evaluation with complaints of elevated blood pressure.  Nicole Leonard states she's had increased swelling in her face, hands, legs and feet over the past week. Denies headaches, states she gets one maybe once a week and it is mild, denies epigastric pain or visual disturbances.  Denies leaking of fluid, vaginal bleeding or decreased fetal movement.   ?

## 2021-12-18 NOTE — Discharge Summary (Signed)
? ?  Please see Final Progress Note. ? ?Imagene Riches, CNM  ?12/18/2021 5:04 PM  ? ?

## 2021-12-18 NOTE — Telephone Encounter (Signed)
Spoke with patient. Patient states swelling in feet, states they are 3 times the normal size, also states facial swelling and in hands along with tingling. She states she is moving around like normal and no changes to diet. She states this has been on going for weeks but is very remarkable today. Patient is taking aspirin daily. Per office protocol, advised patient to go to the ED due to significant swelling.  ? ? ?

## 2021-12-18 NOTE — Telephone Encounter (Signed)
PT

## 2021-12-18 NOTE — Telephone Encounter (Signed)
Pt called with concerns about tingling and numbness sensation in hands and thighs, feet are swollen x3 the normal size. Pt is currently [redacted] weeks pregnant. Pt states that she has been feeling this for about 2 months now. I asked pt if she is able to check her blood pressure- she states that she is unable to at home. Please advise.  ?

## 2021-12-18 NOTE — Final Progress Note (Signed)
Final Progress Note ? ?Patient ID: ?Nicole Leonard ?MRN: [id number] ?DOB/AGE: Mar 12, 1996 26 y.o. ? ?Admit date: 12/18/2021 ?Admitting provider: Imagene Riches, CNM ?Discharge date: 12/18/2021 ? ? ?Admission Diagnoses: ankle edema in pregnancy ?[redacted] weeks gestation ? ? ?Discharge Diagnoses:  ?Principal Problem: ?  Leg swelling in pregnancy in second trimester ?Active Problems: ?  Supervision of high risk pregnancy, antepartum ? Reassuring fetal heart tones ? ?History of Present Illness: The patient is a 26 y.o. female G1P0000 at [redacted]w[redacted]d who presents for  an increase in swelling in her legs, ankles and feet.. 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 She reports an increase in edema over the last two weeks. She denies any regular headaches, visual changes or epigastric pain. Her pregnancy is higher risk, with a BMI of 50.  She is taking daily ASA as advised.  Today, she denies any LOF, vaginal bleeding or contractions. Her baby is moving well, and she denies any cramping. ? ?Past Medical History:  ?Diagnosis Date  ? Patient denies medical problems   ? PID (pelvic inflammatory disease)   ? 2018  ? ? ?Past Surgical History:  ?Procedure Laterality Date  ? denies    ? ? ?No current facility-administered medications on file prior to encounter.  ? ?Current Outpatient Medications on File Prior to Encounter  ?Medication Sig Dispense Refill  ? aspirin EC 81 MG tablet Take 81 mg by mouth daily. Swallow whole.    ? Prenatal Vit-Fe Fumarate-FA (PRENATAL PO) Take by mouth.    ? ? ?Allergies  ?Allergen Reactions  ? Lavender Oil Hives  ? Penicillins Other (See Comments)  ?  Unknown, was as child  ? ? ?Social History  ? ?Socioeconomic History  ? Marital status: Single  ?  Spouse name: Not on file  ? Number of children: Not on file  ? Years of education: Not on file  ? Highest education level: Not on file  ?Occupational History  ? Not on file  ?Tobacco Use  ? Smoking status: Some Days  ?  Types: Cigarettes  ?  Last  attempt to quit: 08/04/2021  ?  Years since quitting: 0.3  ? Smokeless tobacco: Never  ? Tobacco comments:  ?  Patient states ocassionaly smoking maybe once every 2 weeks.   ?  Stopped smoking when found out preg (08/04/2021)  ?Vaping Use  ? Vaping Use: Never used  ?Substance and Sexual Activity  ? Alcohol use: Not Currently  ?  Comment: last use 06/2021 at Thanksgiving "lot of liquor"  ? 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 Sexual activity: Yes  ?Other Topics Concern  ? Not on file  ?Social History Narrative  ? Not on file  ? ?Social Determinants of Health  ? ?Financial Resource Strain: Not on file  ?Food Insecurity: Not on file  ?Transportation Needs: Not on file  ?Physical Activity: Not on file  ?Stress: Not on file  ?Social Connections: Not on file  ?Intimate Partner Violence: Not At Risk  ? Fear of Current or Ex-Partner: No  ? Emotionally Abused: No  ? Physically Abused: No  ? Sexually Abused: No  ? ? ?Family History  ?Problem Relation Age of Onset  ? Diabetes Maternal Grandmother   ? Diabetes Father   ? Diabetes Mother   ?  ? ?Review of Systems  ?Constitutional: Negative.   ?HENT: Negative.    ?  Eyes: Negative.   ?Respiratory: Negative.    ?Cardiovascular:  Positive for leg swelling.  ?     Edema noted this pregnancy, ankles and pedal  ?Gastrointestinal: Negative.   ?Genitourinary: Negative.   ?Musculoskeletal: Negative.   ?Skin: Negative.   ?Neurological: Negative.   ?Endo/Heme/Allergies: Negative.   ?Psychiatric/Behavioral: Negative.     ? ?Physical Exam: ?BP (!) 142/86   Pulse 77   Resp 19   Ht 5\' 4"  (1.626 m)   LMP 07/03/2021 (Exact Date) Comment: lighter than normal  BMI 53.01 kg/m?   ?Physical Exam ?Constitutional:   ?   Appearance: Normal appearance. She is obese.  ?Genitourinary:  ?   Genitourinary Comments: deferred  ?HENT:  ?   Head: Normocephalic and atraumatic.  ?Cardiovascular:  ?   Rate and Rhythm: Normal rate and  regular rhythm.  ?   Pulses: Normal pulses.  ?   Heart sounds: Normal heart sounds.  ?Pulmonary:  ?   Effort: Pulmonary effort is normal.  ?   Breath sounds: Normal breath sounds.  ?Abdominal:  ?   Palpations: Abdomen is soft.  ?   Comments: Gravid abdomen. Adipose  ?Musculoskeletal:  ?   Cervical back: Normal range of motion and neck supple.  ?   Comments: Ankel and pedal edema. Not pittoing, moderate ankle edema.  ?Neurological:  ?   General: No focal deficit present.  ?   Mental Status: She is alert and oriented to person, place, and time.  ?Skin: ?   General: Skin is warm and dry.  ?Psychiatric:     ?   Mood and Affect: Mood normal.     ?   Behavior: Behavior normal.  ?Vitals and nursing note reviewed.  ? ? ?Consults: None ? ?Significant Findings/ Diagnostic Studies: labs:  ?Results for orders placed or performed during the hospital encounter of 12/18/21 (from the past 24 hour(s))  ?Protein / creatinine ratio, urine     Status: None  ? Collection Time: 12/18/21  4:29 PM  ?Result Value Ref Range  ? Creatinine, Urine 138 mg/dL  ? Total Protein, Urine 11 mg/dL  ? Protein Creatinine Ratio 0.08 0.00 - 0.15 mg/mg[Cre]  ?Comprehensive metabolic panel     Status: Abnormal  ? Collection Time: 12/18/21  4:54 PM  ?Result Value Ref Range  ? Sodium 134 (L) 135 - 145 mmol/L  ? Potassium 3.9 3.5 - 5.1 mmol/L  ? Chloride 104 98 - 111 mmol/L  ? CO2 22 22 - 32 mmol/L  ? Glucose, Bld 85 70 - 99 mg/dL  ? BUN 6 6 - 20 mg/dL  ? Creatinine, Ser 0.64 0.44 - 1.00 mg/dL  ? Calcium 9.0 8.9 - 10.3 mg/dL  ? Total Protein 6.7 6.5 - 8.1 g/dL  ? Albumin 3.1 (L) 3.5 - 5.0 g/dL  ? AST 15 15 - 41 U/L  ? ALT 18 0 - 44 U/L  ? Alkaline Phosphatase 64 38 - 126 U/L  ? Total Bilirubin 0.5 0.3 - 1.2 mg/dL  ? GFR, Estimated >60 >60 mL/min  ? Anion gap 8 5 - 15  ?CBC     Status: Abnormal  ? Collection Time: 12/18/21  4:54 PM  ?Result Value Ref Range  ? WBC 12.4 (H) 4.0 - 10.5 K/uL  ? RBC 4.31 3.87 - 5.11 MIL/uL  ? Hemoglobin 12.9 12.0 - 15.0 g/dL  ? HCT  39.2 36.0 - 46.0 %  ? MCV 91.0 80.0 - 100.0 fL  ? MCH 29.9 26.0 - 34.0 pg  ?  MCHC 32.9 30.0 - 36.0 g/dL  ? RDW 12.9 11.5 - 15.5 %  ? Platelets 383 150 - 400 K/uL  ? nRBC 0.0 0.0 - 0.2 %  ?  ? ?Procedures: EFM ?Baseline FHR: 150 beats/min ?Tocometry: no contractions noted per toco. Abdomen palpates soft ? ? ?Hospital Course: The patient was admitted to Labor and Delivery Triage for observation. She was placed on the fetal monitor to evaluate FHTS, which are reassuring. [place] labs were drawn, including a urine sample sent for UPC ratio. Her ankle and pedal edema are mild to moderate, and not pitting.Her blood pressures today were labile, in the 140-80 range. ?A dietary review reveals a higher sodium content.  Her pre eclampsia labs are within normal limits. With reassuring FHTs and normal labs, she is discharged home and may return to work tomorrow. Counsel on reducing sodium in her diet is provided and she has a f/u appointment at [facility] in one week . ? ?Discharge Condition: good ? ?Disposition: Discharge disposition: 01-Home or Self Care ? ? ? ? ? ? ?Diet: Regular diet, Encourage fluids, and a careful review of lowere sodium foods is discussed with her. She is also advised to try coffee or caffeine to increase diuresis. ? ?Discharge Activity: Activity as tolerated and she is provided a work note for today. ? ?Discharge Instructions   ? ? Notify physician for a general feeling that "something is not right"   Complete by: As directed ?  ? Notify physician for increase or change in vaginal discharge   Complete by: As directed ?  ? Notify physician for intestinal cramps, with or without diarrhea, sometimes described as "gas pain"   Complete by: As directed ?  ? Notify physician for leaking of fluid   Complete by: As directed ?  ? Notify physician for low, dull backache, unrelieved by heat or Tylenol   Complete by: As directed ?  ? Notify physician for menstrual like cramps   Complete by: As directed ?  ? Notify  physician for pelvic pressure   Complete by: As directed ?  ? Notify physician for uterine contractions.  These may be painless and feel like the uterus is tightening or the baby is  "balling up"   Complete by: As directed

## 2021-12-26 ENCOUNTER — Encounter: Payer: Self-pay | Admitting: Obstetrics and Gynecology

## 2021-12-26 ENCOUNTER — Ambulatory Visit (INDEPENDENT_AMBULATORY_CARE_PROVIDER_SITE_OTHER): Payer: Medicaid Other | Admitting: Obstetrics and Gynecology

## 2021-12-26 ENCOUNTER — Other Ambulatory Visit: Payer: Medicaid Other

## 2021-12-26 VITALS — BP 122/82 | HR 99 | Wt 319.7 lb

## 2021-12-26 DIAGNOSIS — O99352 Diseases of the nervous system complicating pregnancy, second trimester: Secondary | ICD-10-CM

## 2021-12-26 DIAGNOSIS — Z3A26 26 weeks gestation of pregnancy: Secondary | ICD-10-CM | POA: Diagnosis not present

## 2021-12-26 DIAGNOSIS — O99210 Obesity complicating pregnancy, unspecified trimester: Secondary | ICD-10-CM

## 2021-12-26 DIAGNOSIS — Z131 Encounter for screening for diabetes mellitus: Secondary | ICD-10-CM

## 2021-12-26 DIAGNOSIS — G5600 Carpal tunnel syndrome, unspecified upper limb: Secondary | ICD-10-CM

## 2021-12-26 DIAGNOSIS — O0992 Supervision of high risk pregnancy, unspecified, second trimester: Secondary | ICD-10-CM | POA: Diagnosis not present

## 2021-12-26 DIAGNOSIS — Z23 Encounter for immunization: Secondary | ICD-10-CM | POA: Diagnosis not present

## 2021-12-26 DIAGNOSIS — Z113 Encounter for screening for infections with a predominantly sexual mode of transmission: Secondary | ICD-10-CM

## 2021-12-26 DIAGNOSIS — Z130 Encounter for screening for diseases of the blood and blood-forming organs and certain disorders involving the immune mechanism: Secondary | ICD-10-CM

## 2021-12-26 LAB — POCT URINALYSIS DIPSTICK OB
Bilirubin, UA: NEGATIVE
Blood, UA: NEGATIVE
Glucose, UA: NEGATIVE
Ketones, UA: NEGATIVE
Leukocytes, UA: NEGATIVE
Nitrite, UA: NEGATIVE
Spec Grav, UA: 1.025 (ref 1.010–1.025)
Urobilinogen, UA: 0.2 E.U./dL
pH, UA: 6.5 (ref 5.0–8.0)

## 2021-12-26 NOTE — Progress Notes (Signed)
ROB: Reports fatigue.  Seen in triage last week due to leg/hand/facial swelling. Had borderline BPs, baseline PIH labs nml. Discussed used of compression stockings and hand braces as she also notes numbness and tingling.  For 28 week labs today. Plans to bottle feed, discussed benefits of breastfeeding.  Desires hormonal implant for contraception. For Tdap today, signed blood consent.  Discussed circumcision for female infant, desired. For growth scan at 32 weeks. Discussed IOL at 39 weeks. Patient desires to know when and if she should be taken out of work for pregnancy.  ? ?

## 2021-12-26 NOTE — Progress Notes (Signed)
ROB: She is doing well, feels tired. She was unable to leave urine at intake. TDAP/BTC form done today. ?

## 2021-12-27 LAB — CBC
Hematocrit: 37.3 % (ref 34.0–46.6)
Hemoglobin: 12.8 g/dL (ref 11.1–15.9)
MCH: 31 pg (ref 26.6–33.0)
MCHC: 34.3 g/dL (ref 31.5–35.7)
MCV: 90 fL (ref 79–97)
Platelets: 413 10*3/uL (ref 150–450)
RBC: 4.13 x10E6/uL (ref 3.77–5.28)
RDW: 12.1 % (ref 11.7–15.4)
WBC: 13.6 10*3/uL — ABNORMAL HIGH (ref 3.4–10.8)

## 2021-12-27 LAB — SPECIMEN STATUS REPORT

## 2021-12-28 LAB — RPR: RPR Ser Ql: NONREACTIVE

## 2021-12-28 LAB — GLUCOSE, 1 HOUR GESTATIONAL: Gestational Diabetes Screen: 118 mg/dL (ref 70–139)

## 2022-01-11 ENCOUNTER — Telehealth: Payer: Self-pay | Admitting: Obstetrics and Gynecology

## 2022-01-11 NOTE — Telephone Encounter (Signed)
Pt called to reschedule her upcoming apt due to transportation issues- she states swelling in legs that is painful to touch. Please advise.

## 2022-01-14 ENCOUNTER — Other Ambulatory Visit: Payer: Self-pay

## 2022-01-14 ENCOUNTER — Observation Stay
Admission: EM | Admit: 2022-01-14 | Discharge: 2022-01-14 | Disposition: A | Discharge status: Home / Self Care | Payer: BC Managed Care – PPO | Attending: Certified Nurse Midwife | Admitting: Certified Nurse Midwife

## 2022-01-14 ENCOUNTER — Encounter: Payer: Self-pay | Admitting: Obstetrics and Gynecology

## 2022-01-14 DIAGNOSIS — O0990 Supervision of high risk pregnancy, unspecified, unspecified trimester: Secondary | ICD-10-CM

## 2022-01-14 DIAGNOSIS — O133 Gestational [pregnancy-induced] hypertension without significant proteinuria, third trimester: Secondary | ICD-10-CM | POA: Diagnosis not present

## 2022-01-14 DIAGNOSIS — Z3A29 29 weeks gestation of pregnancy: Secondary | ICD-10-CM | POA: Insufficient documentation

## 2022-01-14 DIAGNOSIS — O1203 Gestational edema, third trimester: Secondary | ICD-10-CM | POA: Diagnosis not present

## 2022-01-14 DIAGNOSIS — Z7982 Long term (current) use of aspirin: Secondary | ICD-10-CM | POA: Insufficient documentation

## 2022-01-14 LAB — COMPREHENSIVE METABOLIC PANEL
ALT: 16 U/L (ref 0–44)
AST: 20 U/L (ref 15–41)
Albumin: 2.9 g/dL — ABNORMAL LOW (ref 3.5–5.0)
Alkaline Phosphatase: 92 U/L (ref 38–126)
Anion gap: 9 (ref 5–15)
BUN: 7 mg/dL (ref 6–20)
CO2: 24 mmol/L (ref 22–32)
Calcium: 9.1 mg/dL (ref 8.9–10.3)
Chloride: 105 mmol/L (ref 98–111)
Creatinine, Ser: 0.76 mg/dL (ref 0.44–1.00)
GFR, Estimated: >60 mL/min (ref >60)
Glucose, Bld: 134 mg/dL — ABNORMAL HIGH (ref 70–99)
Potassium: 3.8 mmol/L (ref 3.5–5.1)
Sodium: 138 mmol/L (ref 135–145)
Total Bilirubin: 0.8 mg/dL (ref 0.3–1.2)
Total Protein: 6.4 g/dL — ABNORMAL LOW (ref 6.5–8.1)

## 2022-01-14 LAB — CBC
HCT: 41 % (ref 36.0–46.0)
Hemoglobin: 13.5 g/dL (ref 12.0–15.0)
MCH: 30 pg (ref 26.0–34.0)
MCHC: 32.9 g/dL (ref 30.0–36.0)
MCV: 91.1 fL (ref 80.0–100.0)
Platelets: 406 10*3/uL — ABNORMAL HIGH (ref 150–400)
RBC: 4.5 MIL/uL (ref 3.87–5.11)
RDW: 13.1 % (ref 11.5–15.5)
WBC: 12.6 10*3/uL — ABNORMAL HIGH (ref 4.0–10.5)
nRBC: 0.2 % (ref 0.0–0.2)

## 2022-01-14 LAB — PROTEIN / CREATININE RATIO, URINE
Creatinine, Urine: 363 mg/dL
Protein Creatinine Ratio: 0.1 mg/mg{Cre} (ref 0.00–0.15)
Total Protein, Urine: 37 mg/dL

## 2022-01-14 MED ORDER — LABETALOL HCL 5 MG/ML IV SOLN: 20.0000 mg | INTRAVENOUS | Status: DC | PRN

## 2022-01-14 MED ORDER — LABETALOL HCL 200 MG PO TABS: 200.0000 mg | ORAL_TABLET | Freq: Two times a day (BID) | ORAL | 3 refills | Status: DC

## 2022-01-14 MED ORDER — LABETALOL HCL 100 MG PO TABS
200.0000 mg | ORAL_TABLET | Freq: Two times a day (BID) | ORAL | Status: DC
Start: 2022-01-14 — End: 2022-01-15
  Administered 2022-01-14: 200 mg via ORAL
  Filled 2022-01-14: qty 2

## 2022-01-14 MED ORDER — HYDROCHLOROTHIAZIDE 12.5 MG PO TABS
12.5000 mg | ORAL_TABLET | Freq: Every day | ORAL | 1 refills | Status: DC
Start: 2022-01-14 — End: 2022-04-06

## 2022-01-14 MED ORDER — LABETALOL HCL 5 MG/ML IV SOLN: 40.0000 mg | INTRAVENOUS | Status: DC | PRN

## 2022-01-14 MED ORDER — HYDROCHLOROTHIAZIDE 12.5 MG PO TABS
12.5000 mg | ORAL_TABLET | Freq: Every day | ORAL | Status: DC
  Administered 2022-01-14: 12.5 mg via ORAL
  Filled 2022-01-14: qty 1

## 2022-01-14 MED ORDER — HYDRALAZINE HCL 20 MG/ML IJ SOLN: 10.0000 mg | INTRAMUSCULAR | Status: DC | PRN

## 2022-01-14 MED ORDER — LABETALOL HCL 5 MG/ML IV SOLN: 80.0000 mg | INTRAVENOUS | Status: DC | PRN

## 2022-01-14 NOTE — OB Triage Note (Signed)
L&D OB Triage Note  SUBJECTIVE Nicole Leonard is a 26 y.o. G1P0000 female at [redacted]w[redacted]d, EDD Estimated Date of Delivery: 04/01/22 who presented to triage with complaints of swelling in her feet, legs and hands. She rates the pain 6/10. She has not tried compression socks. She has not taken anything for the pain. She states that this is mild in comparison to the days that she has worked all day. The swelling gets so bad that she has redness, and tingling.  On arrival BP noted to be elevated. She denies headaches, has had blurred vision, she denies epigastric pain.   OB History  Gravida Para Term Preterm AB Living  1 0 0 0 0 0  SAB IAB Ectopic Multiple Live Births  0 0 0 0 0    # Outcome Date GA Lbr Len/2nd Weight Sex Delivery Anes PTL Lv  1 Current             Medications Prior to Admission  Medication Sig Dispense Refill Last Dose   aspirin EC 81 MG tablet Take 81 mg by mouth daily. Swallow whole.   01/13/2022   Prenatal Vit-Fe Fumarate-FA (PRENATAL PO) Take by mouth.   Past Week     OBJECTIVE  Nursing Evaluation:   BP (!) 163/93   Pulse 96   Temp 97.9 F (36.6 C) (Oral)   Resp 18   Ht 5\' 3"  (1.6 m)   Wt (!) 139.7 kg   LMP 07/03/2021 (Exact Date) Comment: lighter than normal  BMI 54.56 kg/m    Findings:   Reactive NST       NST was performed and has been reviewed by me.  NST INTERPRETATION: appropriate for gestational age. Category I  Mode: External Baseline Rate (A): 150 bpm (fht) Variability: Moderate Accelerations: 10 x 10 Decelerations: None     Contraction Frequency (min): none detected     Latest Ref Rng & Units 01/14/2022    6:46 PM 12/18/2021    4:54 PM 03/20/2021    7:35 PM  CMP  Glucose 70 - 99 mg/dL 05/20/2021   85   92    BUN 6 - 20 mg/dL 7   6   10     Creatinine 0.44 - 1.00 mg/dL 841     3.24    Sodium 135 - 145 mmol/L 138   134   141    Potassium 3.5 - 5.1 mmol/L 3.8   3.9   3.7    Chloride 98 - 111 mmol/L 105   104   108    CO2 22 - 32 mmol/L 24    22   24     Calcium 8.9 - 10.3 mg/dL 9.1   9.0   9.5    Total Protein 6.5 - 8.1 g/dL 6.4   6.7     Total Bilirubin 0.3 - 1.2 mg/dL 0.8   0.5     Alkaline Phos 38 - 126 U/L 92   64     AST 15 - 41 U/L 20   15     ALT 0 - 44 U/L 16   18         Latest Ref Rng & Units 01/14/2022    6:46 PM 12/26/2021    9:36 AM 12/18/2021    4:54 PM  CBC  WBC 4.0 - 10.5 K/uL 12.6   13.6   12.4    Hemoglobin 12.0 - 15.0 g/dL 01/16/2022   02/25/2022   12.9  Hematocrit 36.0 - 46.0 % 41.0   37.3   39.2    Platelets 150 - 400 K/uL 406   413   383     Component Ref Range & Units 18:41 3 wk ago  Creatinine, Urine mg/dL 174  081   Total Protein, Urine mg/dL 37  11 CM   Comment: NO NORMAL RANGE ESTABLISHED FOR THIS TEST  Protein Creatinine Ratio 0.00 - 0.15 mg/mg 0.10  0.08   Legs: Bilateral swelling +3 to knees, Negative clonus, reflexes- +1 bilaterally.   ASSESSMENT Impression:  1.  Pregnancy:  G1P0000 at [redacted]w[redacted]d , EDD Estimated Date of Delivery: 04/01/22 2.  Reassuring fetal and maternal status 3.  Bilateral pitting edema +1.  4. Gestational hypertension   PLAN 1. Current condition and above findings reviewed.  Reassuring fetal and maternal condition. Labs negative for pre eclampsia. Dr.Beasley consulted on plan of care. Start HCTZ 12.5mg  daily, Labetalol 200 mg BID.  2. Discharge home with standard labor/pre eclampsia precautions given to return to L&D or call the office for problems. 3. Continue routine prenatal care, follow up in office on Tuesday for BP check.   Doreene Burke, CNM

## 2022-01-14 NOTE — OB Triage Note (Addendum)
Pt is a G1P0 and [redacted]w[redacted]d presenting to L&D with c/o upper and lower bilateral edema. Pt states she has had swelling but it is worse today. Pt reports pain 6/10 on a 0-10 pain scale and describes it as stabbing when her upper and lower extremities are touched. Pt also reports numbness in her fingers. Pt has +1 pitting edema and tightness upon assessment. Pt reports positive fetal movement and denies VB and LOF. Pt reports blurry vision over the last few weeks, denies epigastric pain, headache. Reflexes are +1. Monitors applied and assessing.

## 2022-01-16 ENCOUNTER — Telehealth: Payer: Self-pay | Admitting: Obstetrics and Gynecology

## 2022-01-16 NOTE — Telephone Encounter (Signed)
Ok, thank you

## 2022-01-16 NOTE — Telephone Encounter (Signed)
PT called and stated "her BP was 183/119". Pt states she was unable to send a message via My Chart . Reached out to CMA - CB took call

## 2022-01-16 NOTE — Telephone Encounter (Signed)
She is not having any symptoms. She denies headaches, blurred vision, chest pain. She did have some severe swelling in legs, feet, hands and arms. Swelling has resolved. She has not started on any of her blood pressure medications that were prescribed to her. She will pick up the medications today and start on them.

## 2022-01-17 ENCOUNTER — Encounter: Payer: Self-pay | Admitting: Obstetrics and Gynecology

## 2022-01-17 ENCOUNTER — Ambulatory Visit (INDEPENDENT_AMBULATORY_CARE_PROVIDER_SITE_OTHER): Payer: BC Managed Care – PPO | Admitting: Obstetrics and Gynecology

## 2022-01-17 VITALS — BP 136/89 | HR 84 | Wt 336.5 lb

## 2022-01-17 DIAGNOSIS — F1291 Cannabis use, unspecified, in remission: Secondary | ICD-10-CM

## 2022-01-17 DIAGNOSIS — O0990 Supervision of high risk pregnancy, unspecified, unspecified trimester: Secondary | ICD-10-CM

## 2022-01-17 DIAGNOSIS — Z3A29 29 weeks gestation of pregnancy: Secondary | ICD-10-CM

## 2022-01-17 DIAGNOSIS — O1203 Gestational edema, third trimester: Secondary | ICD-10-CM

## 2022-01-17 DIAGNOSIS — F32A Depression, unspecified: Secondary | ICD-10-CM | POA: Insufficient documentation

## 2022-01-17 DIAGNOSIS — O99210 Obesity complicating pregnancy, unspecified trimester: Secondary | ICD-10-CM

## 2022-01-17 DIAGNOSIS — O133 Gestational [pregnancy-induced] hypertension without significant proteinuria, third trimester: Secondary | ICD-10-CM

## 2022-01-17 DIAGNOSIS — O99343 Other mental disorders complicating pregnancy, third trimester: Secondary | ICD-10-CM

## 2022-01-17 LAB — POCT URINALYSIS DIPSTICK OB
Bilirubin, UA: NEGATIVE
Glucose, UA: NEGATIVE
Ketones, UA: NEGATIVE
Leukocytes, UA: NEGATIVE
Nitrite, UA: NEGATIVE
Spec Grav, UA: 1.025 (ref 1.010–1.025)
Urobilinogen, UA: 0.2 E.U./dL
pH, UA: 6.5 (ref 5.0–8.0)

## 2022-01-17 MED ORDER — BUPROPION HCL ER (XL) 150 MG PO TB24: 150.0000 mg | ORAL_TABLET | Freq: Every day | ORAL | 3 refills | Status: DC

## 2022-01-17 NOTE — Progress Notes (Addendum)
Problem OB: Patient presents for f/u BP check. Was seen in triage over the weekend (5/28) due to complaints of significant leg swelling and pain.  BPs noted to be elevated (160s/90s), diagnosed with GHTN. Started on Labetalol and HCTZ however patient only started the medication yesterday after noting even worsening swelling.  PIH labs at that time negative. Today patient noting headache and blurred vision, will repeat PIH labs again today. She has +3 proteinuria. BPs improved today, but still with significant swelling (+2 edema up to shins). Reiterated red flags for pre-eclampsia.  Patient also has questions about dental clearance. Notes she was given a note by Dr. Logan Bores last visit, but is still unsure if it is safe. Provided new letter for patient detailing specifics of dental care. Given reassurance.   Lastly, patient desires to discuss possible depression symptoms. Notes that she has been feeling more depressed lately over the past several weeks. She just does not feel excited about the pregnancy any more and is worried that something bad is going to happen as she has been having dreams. Notes that she used to smoke to relieve any mood disturbances but cannot since she is pregnant which makes her feel worse. PHQ-9 score today is 13, GAD-7 score is 8. Discussed management options with patient, including counseling, medications, or both. Patient would like to try medications. Will prescribe Wellbutrin to also help with smoking. Also will have SW follow up with patient. 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    RTC in 2 weeks for routine OB visit. To f/u depression symptoms also.    A total of 25 minutes were spent face-to-face with the patient during this encounter and over half of that time involved counseling and coordination of care.   Hildred Laser, MD [facility]

## 2022-01-17 NOTE — Patient Instructions (Signed)
Hypertension During Pregnancy High blood pressure (hypertension) is when the force of blood pumping through the arteries is high enough to cause problems with your health. Arteries are blood vessels that carry blood from the heart throughout the body. Hypertension during pregnancy can cause problems for you and your baby. It can be mild or severe. There are different types of hypertension that can happen during pregnancy. These include: Chronic hypertension. This happens when you had high blood pressure before you became pregnant, and it continues during the pregnancy. Hypertension that develops before you are [redacted] weeks pregnant and continues during the pregnancy is also called chronic hypertension. If you have chronic hypertension, it will not go away after you have your baby. You will need follow-up visits with your health care provider after you have your baby. Your health care provider may want you to keep taking medicine for your blood pressure. Gestational hypertension. This is hypertension that develops after the 20th week of pregnancy. Gestational hypertension usually goes away after you have your baby, but your health care provider will need to monitor your blood pressure to make sure that it is getting better. Postpartum hypertension. This is high blood pressure that was present before delivery and continues after delivery or that starts after delivery. This usually occurs within 48 hours after childbirth but may occur up to 6 weeks after giving birth. When hypertension during pregnancy is severe, it is a medical emergency that requires treatment right away. How does this affect me? Women who have hypertension during pregnancy have a greater chance of developing hypertension later in life or during future pregnancies. In some cases, hypertension during pregnancy can cause serious complications, such as: Stroke. Heart attack. Injury to other organs, such as kidneys, lungs, or  liver. Preeclampsia. A condition called hemolysis, elevated liver enzymes, and low platelet count (HELLP) syndrome. Convulsions or seizures. Placental abruption. How does this affect my baby? Hypertension during pregnancy can affect your baby. Your baby may: Be born early (prematurely). Not weigh as much as he or she should at birth (low birth weight). Not tolerate labor well, leading to an unplanned cesarean delivery. This condition may also result in a baby's death before birth (stillbirth). What are the risks? There are certain factors that make it more likely for you to develop hypertension during pregnancy. These include: Having hypertension during a previous pregnancy or a family history of hypertension. Being overweight. Being age 35 or older. Being pregnant for the first time. Being pregnant with more than one baby. Becoming pregnant using fertilization methods, such as IVF (in vitro fertilization). Having other medical problems, such as diabetes, kidney disease, or lupus. What can I do to lower my risk? The exact cause of hypertension during pregnancy is not known. You may be able to lower your risk by: Maintaining a healthy weight. Eating a healthy and balanced diet. Following your health care provider's instructions about treating any long-term conditions that you had before becoming pregnant. It is very important to keep all of your prenatal care appointments. Your health care provider will check your blood pressure and make sure that your pregnancy is progressing as expected. If a problem is found, early treatment can prevent complications. How is this treated? Treatment for hypertension during pregnancy varies depending on the type of hypertension you have and how serious it is. If you were taking medicine for high blood pressure before you became pregnant, talk with your health care provider. You may need to change medicine during pregnancy because some   medicines, like ACE  inhibitors, may not be considered safe for your baby. If you have gestational hypertension, your health care provider may order medicine to treat this during pregnancy. If you are at risk for preeclampsia, your health care provider may recommend that you take a low-dose aspirin during your pregnancy. If you have severe hypertension, you may need to be hospitalized so you and your baby can be monitored closely. You may also need to be given medicine to lower your blood pressure. In some cases, if your condition gets worse, you may need to deliver your baby early. Follow these instructions at home: Eating and drinking  Drink enough fluid to keep your urine pale yellow. Avoid caffeine. Lifestyle Do not use any products that contain nicotine or tobacco. These products include cigarettes, chewing tobacco, and vaping devices, such as e-cigarettes. If you need help quitting, ask your health care provider. Do not use alcohol or drugs. Avoid stress as much as possible. Rest and get plenty of sleep. Regular exercise can help to reduce your blood pressure. Ask your health care provider what kinds of exercise are best for you. General instructions Take over-the-counter and prescription medicines only as told by your health care provider. Keep all prenatal and follow-up visits. This is important. Contact a health care provider if: You have symptoms that your health care provider told you may require more treatment or monitoring, such as: Headaches. Nausea or vomiting. Abdominal pain. Dizziness. Light-headedness. Get help right away if: You have symptoms of serious complications, such as: Severe abdominal pain that does not get better with treatment. A severe headache that does not get better, blurred vision, or double vision. Vomiting that does not get better. Sudden, rapid weight gain or swelling in your hands, ankles, or face. Vaginal bleeding. Blood in your urine. Shortness of breath or chest  pain. Weakness on one side of your body or difficulty speaking. Your baby is not moving as much as usual. These symptoms may represent a serious problem that is an emergency. Do not wait to see if the symptoms will go away. Get medical help right away. Call your local emergency services (911 in the [place] Do not drive yourself to the hospital. Summary Hypertension during pregnancy can cause problems for you and your baby. Treatment for hypertension during pregnancy varies depending on the type of hypertension you have and how serious it is. Keep all prenatal and follow-up visits. This is important. Get help right away if you have symptoms of serious complications related to high blood pressure. This information is not intended to replace advice given to you by your health care provider. Make sure you discuss any questions you have with your health care provider. Document Revised: 04/28/2020 Document Reviewed: 04/28/2020 Elsevier Patient Education  2023 [employer]  

## 2022-01-17 NOTE — Progress Notes (Signed)
ROB: She is here today for BP check follow up from ED. She was evaluated in ED on 01/14/2022 for edema in hands, legs and feet. She was put on HCTZ and Labetalol. She started her medication yesterday. Today she complains of headaches and swelling.

## 2022-01-23 ENCOUNTER — Other Ambulatory Visit: Payer: Medicaid Other

## 2022-01-23 ENCOUNTER — Encounter: Payer: Medicaid Other | Admitting: Obstetrics and Gynecology

## 2022-01-24 LAB — PROTEIN / CREATININE RATIO, URINE
Creatinine, Urine: 77.1 mg/dL
Protein, Ur: 14.2 mg/dL
Protein/Creat Ratio: 184 mg/g creat (ref 0–200)

## 2022-01-24 LAB — CBC
Hematocrit: 38.5 % (ref 34.0–46.6)
Hemoglobin: 13.1 g/dL (ref 11.1–15.9)
MCH: 30.5 pg (ref 26.6–33.0)
MCHC: 34 g/dL (ref 31.5–35.7)
MCV: 90 fL (ref 79–97)
Platelets: 367 10*3/uL (ref 150–450)
RBC: 4.29 x10E6/uL (ref 3.77–5.28)
RDW: 12.6 % (ref 11.7–15.4)
WBC: 11.9 10*3/uL — ABNORMAL HIGH (ref 3.4–10.8)

## 2022-01-24 LAB — COMPREHENSIVE METABOLIC PANEL
ALT: 22 IU/L (ref 0–32)
AST: 21 IU/L (ref 0–40)
Albumin/Globulin Ratio: 1.4 (ref 1.2–2.2)
Albumin: 3.8 g/dL — ABNORMAL LOW (ref 3.9–5.0)
Alkaline Phosphatase: 107 IU/L (ref 44–121)
BUN/Creatinine Ratio: 10 (ref 9–23)
BUN: 7 mg/dL (ref 6–20)
Bilirubin Total: 0.5 mg/dL (ref 0.0–1.2)
CO2: 21 mmol/L (ref 20–29)
Calcium: 9.9 mg/dL (ref 8.7–10.2)
Chloride: 101 mmol/L (ref 96–106)
Creatinine, Ser: 0.68 mg/dL (ref 0.57–1.00)
Globulin, Total: 2.7 g/dL (ref 1.5–4.5)
Glucose: 82 mg/dL (ref 70–99)
Potassium: 4.1 mmol/L (ref 3.5–5.2)
Sodium: 138 mmol/L (ref 134–144)
Total Protein: 6.5 g/dL (ref 6.0–8.5)
eGFR: 123 mL/min/{1.73_m2} (ref >59)

## 2022-01-25 LAB — PAIN MGT SCRN (14 DRUGS), UR
Amphetamine Scrn, Ur: NEGATIVE ng/mL
BARBITURATE SCREEN URINE: NEGATIVE ng/mL
BENZODIAZEPINE SCREEN, URINE: NEGATIVE ng/mL
Buprenorphine, Urine: NEGATIVE ng/mL
CANNABINOIDS UR QL SCN: POSITIVE ng/mL — AB
Cocaine (Metab) Scrn, Ur: NEGATIVE ng/mL
Creatinine(Crt), U: 79.2 mg/dL (ref 20.0–300.0)
Fentanyl, Urine: NEGATIVE pg/mL
Meperidine Screen, Urine: NEGATIVE ng/mL
Methadone Screen, Urine: NEGATIVE ng/mL
OXYCODONE+OXYMORPHONE UR QL SCN: NEGATIVE ng/mL
Opiate Scrn, Ur: NEGATIVE ng/mL
Ph of Urine: 6.7 (ref 4.5–8.9)
Phencyclidine Qn, Ur: NEGATIVE ng/mL
Propoxyphene Scrn, Ur: NEGATIVE ng/mL
Tramadol Screen, Urine: NEGATIVE ng/mL

## 2022-01-30 ENCOUNTER — Encounter: Payer: Self-pay | Admitting: Obstetrics and Gynecology

## 2022-01-30 ENCOUNTER — Ambulatory Visit (INDEPENDENT_AMBULATORY_CARE_PROVIDER_SITE_OTHER): Payer: Medicaid Other | Admitting: Obstetrics and Gynecology

## 2022-01-30 ENCOUNTER — Other Ambulatory Visit: Payer: Self-pay

## 2022-01-30 ENCOUNTER — Encounter: Payer: Self-pay | Admitting: Obstetrics

## 2022-01-30 ENCOUNTER — Observation Stay
Admission: EM | Admit: 2022-01-30 | Discharge: 2022-01-30 | Disposition: A | Discharge status: Home / Self Care | Payer: BC Managed Care – PPO | Source: Home / Self Care | Admitting: Obstetrics

## 2022-01-30 ENCOUNTER — Ambulatory Visit (INDEPENDENT_AMBULATORY_CARE_PROVIDER_SITE_OTHER): Payer: BC Managed Care – PPO

## 2022-01-30 VITALS — BP 162/102 | HR 105 | Wt 321.3 lb

## 2022-01-30 DIAGNOSIS — Z3A31 31 weeks gestation of pregnancy: Secondary | ICD-10-CM | POA: Insufficient documentation

## 2022-01-30 DIAGNOSIS — O10013 Pre-existing essential hypertension complicating pregnancy, third trimester: Secondary | ICD-10-CM | POA: Insufficient documentation

## 2022-01-30 DIAGNOSIS — O99324 Drug use complicating childbirth: Secondary | ICD-10-CM | POA: Diagnosis not present

## 2022-01-30 DIAGNOSIS — Z7982 Long term (current) use of aspirin: Secondary | ICD-10-CM | POA: Diagnosis not present

## 2022-01-30 DIAGNOSIS — F329 Major depressive disorder, single episode, unspecified: Secondary | ICD-10-CM | POA: Diagnosis not present

## 2022-01-30 DIAGNOSIS — O0992 Supervision of high risk pregnancy, unspecified, second trimester: Secondary | ICD-10-CM

## 2022-01-30 DIAGNOSIS — O133 Gestational [pregnancy-induced] hypertension without significant proteinuria, third trimester: Secondary | ICD-10-CM | POA: Diagnosis not present

## 2022-01-30 DIAGNOSIS — O134 Gestational [pregnancy-induced] hypertension without significant proteinuria, complicating childbirth: Secondary | ICD-10-CM | POA: Diagnosis not present

## 2022-01-30 DIAGNOSIS — Z3490 Encounter for supervision of normal pregnancy, unspecified, unspecified trimester: Secondary | ICD-10-CM

## 2022-01-30 DIAGNOSIS — F1211 Cannabis abuse, in remission: Secondary | ICD-10-CM | POA: Diagnosis not present

## 2022-01-30 DIAGNOSIS — O99210 Obesity complicating pregnancy, unspecified trimester: Secondary | ICD-10-CM | POA: Diagnosis not present

## 2022-01-30 DIAGNOSIS — Z79899 Other long term (current) drug therapy: Secondary | ICD-10-CM | POA: Insufficient documentation

## 2022-01-30 DIAGNOSIS — O364XX0 Maternal care for intrauterine death, not applicable or unspecified: Secondary | ICD-10-CM | POA: Diagnosis not present

## 2022-01-30 DIAGNOSIS — O99214 Obesity complicating childbirth: Secondary | ICD-10-CM | POA: Diagnosis not present

## 2022-01-30 DIAGNOSIS — F17210 Nicotine dependence, cigarettes, uncomplicated: Secondary | ICD-10-CM | POA: Diagnosis not present

## 2022-01-30 DIAGNOSIS — O99344 Other mental disorders complicating childbirth: Secondary | ICD-10-CM | POA: Diagnosis not present

## 2022-01-30 DIAGNOSIS — O1002 Pre-existing essential hypertension complicating childbirth: Secondary | ICD-10-CM | POA: Diagnosis not present

## 2022-01-30 DIAGNOSIS — O99334 Smoking (tobacco) complicating childbirth: Secondary | ICD-10-CM | POA: Diagnosis not present

## 2022-01-30 DIAGNOSIS — O0990 Supervision of high risk pregnancy, unspecified, unspecified trimester: Secondary | ICD-10-CM

## 2022-01-30 HISTORY — DX: Essential (primary) hypertension: I10

## 2022-01-30 LAB — CBC
HCT: 40.8 % (ref 36.0–46.0)
Hemoglobin: 13.5 g/dL (ref 12.0–15.0)
MCH: 29.7 pg (ref 26.0–34.0)
MCHC: 33.1 g/dL (ref 30.0–36.0)
MCV: 89.9 fL (ref 80.0–100.0)
Platelets: 409 10*3/uL — ABNORMAL HIGH (ref 150–400)
RBC: 4.54 MIL/uL (ref 3.87–5.11)
RDW: 13.2 % (ref 11.5–15.5)
WBC: 13.3 10*3/uL — ABNORMAL HIGH (ref 4.0–10.5)
nRBC: 0 % (ref 0.0–0.2)

## 2022-01-30 LAB — COMPREHENSIVE METABOLIC PANEL
ALT: 32 U/L (ref 0–44)
AST: 22 U/L (ref 15–41)
Albumin: 3.3 g/dL — ABNORMAL LOW (ref 3.5–5.0)
Alkaline Phosphatase: 97 U/L (ref 38–126)
Anion gap: 9 (ref 5–15)
BUN: 7 mg/dL (ref 6–20)
CO2: 21 mmol/L — ABNORMAL LOW (ref 22–32)
Calcium: 9.6 mg/dL (ref 8.9–10.3)
Chloride: 106 mmol/L (ref 98–111)
Creatinine, Ser: 0.63 mg/dL (ref 0.44–1.00)
GFR, Estimated: >60 mL/min (ref >60)
Glucose, Bld: 110 mg/dL — ABNORMAL HIGH (ref 70–99)
Potassium: 4.3 mmol/L (ref 3.5–5.1)
Sodium: 136 mmol/L (ref 135–145)
Total Bilirubin: 0.9 mg/dL (ref 0.3–1.2)
Total Protein: 7.2 g/dL (ref 6.5–8.1)

## 2022-01-30 LAB — POCT URINALYSIS DIPSTICK OB
Bilirubin, UA: NEGATIVE
Blood, UA: NEGATIVE
Glucose, UA: NEGATIVE
Ketones, UA: NEGATIVE
Leukocytes, UA: NEGATIVE
Nitrite, UA: NEGATIVE
POC,PROTEIN,UA: NEGATIVE
Spec Grav, UA: 1.015 (ref 1.010–1.025)
Urobilinogen, UA: 0.2 E.U./dL
pH, UA: 7 (ref 5.0–8.0)

## 2022-01-30 LAB — PROTEIN / CREATININE RATIO, URINE
Creatinine, Urine: 28 mg/dL
Protein Creatinine Ratio: 0.21 mg/mg{Cre} — ABNORMAL HIGH (ref 0.00–0.15)
Total Protein, Urine: 6 mg/dL

## 2022-01-30 MED ORDER — LABETALOL HCL 5 MG/ML IV SOLN: 20.0000 mg | INTRAVENOUS | Status: DC | PRN

## 2022-01-30 MED ORDER — HYDRALAZINE HCL 20 MG/ML IJ SOLN: 10.0000 mg | INTRAMUSCULAR | Status: DC | PRN

## 2022-01-30 MED ORDER — LABETALOL HCL 5 MG/ML IV SOLN: 80.0000 mg | INTRAVENOUS | Status: DC | PRN

## 2022-01-30 MED ORDER — LABETALOL HCL 5 MG/ML IV SOLN: 40.0000 mg | INTRAVENOUS | Status: DC | PRN

## 2022-01-30 NOTE — Progress Notes (Signed)
ROB: Patient states she did not start her Wellbutrin because she did not think she needed it and was doing fine without it.  She states that she has been taking labetalol and HCTZ.  Reports she has not felt the baby move very much in the last "3 days".  Fetal heart tones not heard today.-  Ultrasound scheduled in 10 minutes for growth.  [place] labs last visit normal.  Currently awaiting ultrasound results.  Patient will need an increase in labetalol to 200 twice daily to attempt to control blood pressures.  Ultrasound not completed showing no fetal heart tones.  Baby vertex presentation. Discussed IUFD with parents.  All questions answered as well as possible. Nicole Leonard states that she would like a day to get her job arranged but then would like to be induced.  I will plan to set this up for Wednesday at midnight going into Thursday.  Plan repeat blood pressures today.  Double labetalol dose.  Preeclamptic labs today patient sent to labor delivery today -immediately for blood pressure management and stat preeclamptic labs.  She would like to plan for discharge once her pressures are controlled if her preeclamptic labs are negative and return after arranging her schedule with her job.

## 2022-01-30 NOTE — OB Triage Note (Signed)
Patient is a G1P0 at [redacted]w[redacted]d who presents to unit for a PreE workup sent from the office after discovering an IUFD. Pt reports no feeling baby move for "a couple days", denies ctx, LOF, and vaginal bleeding. Also denies HA, blurry vision, and right epigastric pain. Toco applied and assessing. Initial BP 140/92, cycling q40min.

## 2022-01-30 NOTE — OB Triage Note (Signed)
Discharge instructions, labor precautions, and follow-up care reviewed with patient and significant other. All questions answered. Patient verbalized understanding. Discharged ambulatory off unit.  Instructed to return for IOL on 6/15 at 0001.

## 2022-01-30 NOTE — Addendum Note (Signed)
Addended by: Elonda Husky on: 01/30/2022 02:30 PM   Modules accepted: Orders

## 2022-01-30 NOTE — Progress Notes (Signed)
ROB. Patient states fetal movement but is feeling softer movements than before. She states continuing to take BP medications along with aspirin. Patient states her recent swelling and mood concerns have gotten better. Patient states no questions or concerns at this time.

## 2022-01-31 ENCOUNTER — Other Ambulatory Visit: Payer: Self-pay | Admitting: Obstetrics and Gynecology

## 2022-02-01 ENCOUNTER — Other Ambulatory Visit: Payer: Self-pay

## 2022-02-01 ENCOUNTER — Inpatient Hospital Stay
Admission: EM | Admit: 2022-02-01 | Discharge: 2022-02-02 | DRG: 806 | Disposition: A | Discharge status: Home / Self Care | Payer: BC Managed Care – PPO | Attending: Obstetrics and Gynecology | Admitting: Obstetrics and Gynecology

## 2022-02-01 ENCOUNTER — Encounter: Payer: Self-pay | Admitting: Obstetrics and Gynecology

## 2022-02-01 ENCOUNTER — Encounter: Payer: Self-pay | Admitting: Anesthesiology

## 2022-02-01 DIAGNOSIS — O99214 Obesity complicating childbirth: Secondary | ICD-10-CM | POA: Diagnosis present

## 2022-02-01 DIAGNOSIS — Z3A31 31 weeks gestation of pregnancy: Secondary | ICD-10-CM | POA: Diagnosis not present

## 2022-02-01 DIAGNOSIS — O99334 Smoking (tobacco) complicating childbirth: Secondary | ICD-10-CM | POA: Diagnosis present

## 2022-02-01 DIAGNOSIS — O364XX0 Maternal care for intrauterine death, not applicable or unspecified: Secondary | ICD-10-CM | POA: Diagnosis present

## 2022-02-01 DIAGNOSIS — F17210 Nicotine dependence, cigarettes, uncomplicated: Secondary | ICD-10-CM | POA: Diagnosis present

## 2022-02-01 DIAGNOSIS — O99344 Other mental disorders complicating childbirth: Secondary | ICD-10-CM | POA: Diagnosis not present

## 2022-02-01 DIAGNOSIS — F1211 Cannabis abuse, in remission: Secondary | ICD-10-CM | POA: Diagnosis present

## 2022-02-01 DIAGNOSIS — O134 Gestational [pregnancy-induced] hypertension without significant proteinuria, complicating childbirth: Secondary | ICD-10-CM | POA: Diagnosis not present

## 2022-02-01 DIAGNOSIS — F329 Major depressive disorder, single episode, unspecified: Secondary | ICD-10-CM | POA: Diagnosis not present

## 2022-02-01 DIAGNOSIS — O1002 Pre-existing essential hypertension complicating childbirth: Secondary | ICD-10-CM | POA: Diagnosis present

## 2022-02-01 DIAGNOSIS — O99324 Drug use complicating childbirth: Secondary | ICD-10-CM | POA: Diagnosis present

## 2022-02-01 DIAGNOSIS — Z7982 Long term (current) use of aspirin: Secondary | ICD-10-CM | POA: Diagnosis not present

## 2022-02-01 LAB — CBC
HCT: 41.8 % (ref 36.0–46.0)
Hemoglobin: 13.8 g/dL (ref 12.0–15.0)
MCH: 29.6 pg (ref 26.0–34.0)
MCHC: 33 g/dL (ref 30.0–36.0)
MCV: 89.7 fL (ref 80.0–100.0)
Platelets: 460 10*3/uL — ABNORMAL HIGH (ref 150–400)
RBC: 4.66 MIL/uL (ref 3.87–5.11)
RDW: 13.2 % (ref 11.5–15.5)
WBC: 12.9 10*3/uL — ABNORMAL HIGH (ref 4.0–10.5)
nRBC: 0 % (ref 0.0–0.2)

## 2022-02-01 LAB — RPR: RPR Ser Ql: NONREACTIVE

## 2022-02-01 LAB — TYPE AND SCREEN
ABO/RH(D): O POS
ABO/RH(D): O POS
Antibody Screen: NEGATIVE
Antibody Screen: NEGATIVE

## 2022-02-01 MED ORDER — OXYTOCIN BOLUS FROM INFUSION
333.0000 mL | Freq: Once | INTRAVENOUS | Status: AC
  Administered 2022-02-02: 333 mL via INTRAVENOUS

## 2022-02-01 MED ORDER — MISOPROSTOL 200 MCG PO TABS
200.0000 ug | ORAL_TABLET | ORAL | Status: DC
  Administered 2022-02-01: 200 ug via ORAL

## 2022-02-01 MED ORDER — LACTATED RINGERS IV SOLN: INTRAVENOUS | Status: DC

## 2022-02-01 MED ORDER — LIDOCAINE HCL (PF) 1 % IJ SOLN
INTRAMUSCULAR | Status: AC
  Filled 2022-02-01: qty 30

## 2022-02-01 MED ORDER — ONDANSETRON HCL 4 MG/2ML IJ SOLN: 4.0000 mg | Freq: Four times a day (QID) | INTRAMUSCULAR | Status: DC | PRN

## 2022-02-01 MED ORDER — ACETAMINOPHEN 325 MG PO TABS: 650.0000 mg | ORAL_TABLET | ORAL | Status: DC | PRN

## 2022-02-01 MED ORDER — OXYTOCIN 10 UNIT/ML IJ SOLN
INTRAMUSCULAR | Status: AC
  Filled 2022-02-01: qty 2

## 2022-02-01 MED ORDER — MISOPROSTOL 200 MCG PO TABS
200.0000 ug | ORAL_TABLET | ORAL | Status: DC | PRN
  Administered 2022-02-01 (×3): 200 ug via VAGINAL
  Filled 2022-02-01 (×5): qty 1

## 2022-02-01 MED ORDER — BUTORPHANOL TARTRATE 2 MG/ML IJ SOLN
2.0000 mg | INTRAMUSCULAR | Status: DC | PRN
  Administered 2022-02-01 (×2): 2 mg via INTRAVENOUS
  Filled 2022-02-01 (×3): qty 1

## 2022-02-01 MED ORDER — AMMONIA AROMATIC IN INHA
RESPIRATORY_TRACT | Status: AC
  Filled 2022-02-01: qty 10

## 2022-02-01 MED ORDER — LACTATED RINGERS IV SOLN: 500.0000 mL | INTRAVENOUS | Status: DC | PRN

## 2022-02-01 MED ORDER — LIDOCAINE HCL (PF) 1 % IJ SOLN: 30.0000 mL | INTRAMUSCULAR | Status: DC | PRN

## 2022-02-01 MED ORDER — MISOPROSTOL 50MCG HALF TABLET
50.0000 ug | ORAL_TABLET | ORAL | Status: DC | PRN
  Administered 2022-02-01 (×2): 50 ug via VAGINAL
  Filled 2022-02-01 (×2): qty 1

## 2022-02-01 MED ORDER — MISOPROSTOL 200 MCG PO TABS
ORAL_TABLET | ORAL | Status: AC
  Filled 2022-02-01: qty 4

## 2022-02-01 MED ORDER — LABETALOL HCL 100 MG PO TABS
300.0000 mg | ORAL_TABLET | Freq: Two times a day (BID) | ORAL | Status: DC
  Administered 2022-02-01 – 2022-02-02 (×3): 300 mg via ORAL
  Filled 2022-02-01 (×3): qty 3

## 2022-02-01 MED ORDER — SOD CITRATE-CITRIC ACID 500-334 MG/5ML PO SOLN: 30.0000 mL | ORAL | Status: DC | PRN

## 2022-02-01 MED ORDER — HYDROCHLOROTHIAZIDE 25 MG PO TABS
25.0000 mg | ORAL_TABLET | Freq: Every day | ORAL | Status: DC
  Administered 2022-02-01 – 2022-02-02 (×2): 25 mg via ORAL
  Filled 2022-02-01 (×2): qty 1

## 2022-02-01 MED ORDER — OXYTOCIN-SODIUM CHLORIDE 30-0.9 UT/500ML-% IV SOLN
2.5000 [IU]/h | INTRAVENOUS | Status: DC
  Filled 2022-02-01: qty 500

## 2022-02-01 NOTE — H&P (Signed)
Obstetric History and Physical  Nicole Leonard is a 26 y.o. G1P0000 with IUP at [redacted]w[redacted]d presenting for scheduled IOL for IUFD identified at 31.2 weeks. Current pregnancy complicated by morbid obesity, marijuana abuse (in remission), gestational HTN, diagnosed at [redacted] weeks gestation, currently on Labetalol/HCTZ.  Also with perinatal depression, was initiated on Wellbutrin (also due to being a smoker to help weaning), however patient never started medication.   Currently denies complaints today. Is feeling slightly crampy. Denies contractions, vaginal bleeding, LOF.  Prenatal Course Source of Care: [facility] with onset of care at 12 weeks Pregnancy complications or risks: Patient Active Problem List   Diagnosis Date Noted   IUFD at 20 weeks or more of gestation 02/01/2022   Pregnancy 01/30/2022   Depression affecting pregnancy in third trimester, antepartum 01/17/2022   Labor and delivery, indication for care 01/14/2022   Supervision of high risk pregnancy, antepartum 12/18/2021   Leg swelling in pregnancy in second trimester 12/18/2021     Prenatal labs and studies: ABO, Rh: --/--/O POS (06/15 0052) Antibody: NEG (06/15 0052) Rubella: 2.16 (02/03 0929) RPR: Non Reactive (05/09 0936)  HBsAg: Negative (02/03 0929)  HIV: Non Reactive (02/03 0929)  GBS: unknown 1 hr Glucola  normal, 118 Genetic screening normal Anatomy US normal   Past Medical History:  Diagnosis Date   Hypertension    Patient denies medical problems    PID (pelvic inflammatory disease)    2018    Past Surgical History:  Procedure Laterality Date   denies      OB History  Gravida Para Term Preterm AB Living  1 0 0 0 0 0  SAB IAB Ectopic Multiple Live Births  0 0 0 0 0    # Outcome Date GA Lbr Len/2nd Weight Sex Delivery Anes PTL Lv  1 Current             Social History   Socioeconomic History   Marital status: Single    Spouse name: Not on file   Number of children: Not on file    Years of education: Not on file   Highest education level: Not on file  Occupational History   Not on file  Tobacco Use   Smoking status: Some Days    Packs/day: 0.25    Types: Cigarettes    Last attempt to quit: 08/04/2021    Years since quitting: 0.4   Smokeless tobacco: Never   Tobacco comments:    Patient states ocassionaly smoking maybe once every 2 weeks.     Stopped smoking when found out preg (08/04/2021)  Vaping Use   Vaping Use: Never used  Substance and Sexual Activity   Alcohol use: Not Currently    Comment: last use 06/2021 at Thanksgiving "lot of liquor"   Drug use: Not Currently    Types: Marijuana    Comment: every day, stopped 08/04/21 when home UPT positive   Sexual activity: Yes    Birth control/protection: Implant  Other Topics Concern   Not on file  Social History Narrative   Not on file   Social Determinants of Health   Financial Resource Strain: Not on file  Food Insecurity: Not on file  Transportation Needs: Not on file  Physical Activity: Not on file  Stress: Not on file  Social Connections: Not on file    Family History  Problem Relation Age of Onset   Diabetes Maternal Grandmother    Diabetes Father    Diabetes Mother  Medications Prior to Admission  Medication Sig Dispense Refill Last Dose   aspirin EC 81 MG tablet Take 81 mg by mouth daily. Swallow whole.   01/31/2022   hydrochlorothiazide (HYDRODIURIL) 12.5 MG tablet Take 1 tablet (12.5 mg total) by mouth daily. 30 tablet 1 01/31/2022   labetalol (NORMODYNE) 200 MG tablet Take 1 tablet (200 mg total) by mouth 2 (two) times daily. 60 tablet 3 01/31/2022   Prenatal Vit-Fe Fumarate-FA (PRENATAL PO) Take by mouth.   01/31/2022   buPROPion (WELLBUTRIN XL) 150 MG 24 hr tablet Take 1 tablet (150 mg total) by mouth daily. (Patient not taking: Reported on 01/30/2022) 90 tablet 3     Allergies  Allergen Reactions   Lavender Oil Hives   Penicillins Other (See Comments)    Unknown, was as  child    Review of Systems: Negative except for what is mentioned in HPI.  Physical Exam: BP (!) 150/87   Pulse (!) 106   Temp 97.6 F (36.4 C) (Oral)   Resp 17   Ht 5\' 4"  (1.626 m)   Wt (!) 146 kg   LMP 07/03/2021 (Exact Date) Comment: lighter than normal  BMI 55.25 kg/m  CONSTITUTIONAL: Well-developed, well-nourished female in no acute distress.  HENT:  Normocephalic, atraumatic, External right and left ear normal. Oropharynx is clear and moist EYES: Conjunctivae and EOM are normal. Pupils are equal, round, and reactive to light. No scleral icterus.  NECK: Normal range of motion, supple, no masses SKIN: Skin is warm and dry. No rash noted. Not diaphoretic. No erythema. No pallor. NEUROLOGIC: Alert and oriented to person, place, and time. Normal reflexes, muscle tone coordination. No cranial nerve deficit noted. PSYCHIATRIC: Normal mood and affect. Normal behavior. Normal judgment and thought content. CARDIOVASCULAR: Normal heart rate noted, regular rhythm RESPIRATORY: Effort and breath sounds normal, no problems with respiration noted ABDOMEN: Soft, nontender, nondistended, gravid. MUSCULOSKELETAL: Normal range of motion. No edema and no tenderness. 2+ distal pulses.  Cervical Exam: Dilatation 0.5 cm   Effacement 40%   Station -3   Presentation: cephalic    Pertinent Labs/Studies:   Results for orders placed or performed during the hospital encounter of 02/01/22 (from the past 24 hour(s))  CBC     Status: Abnormal   Collection Time: 02/01/22 12:52 AM  Result Value Ref Range   WBC 12.9 (H) 4.0 - 10.5 K/uL   RBC 4.66 3.87 - 5.11 MIL/uL   Hemoglobin 13.8 12.0 - 15.0 g/dL   HCT 02/03/22 85.0 - 27.7 %   MCV 89.7 80.0 - 100.0 fL   MCH 29.6 26.0 - 34.0 pg   MCHC 33.0 30.0 - 36.0 g/dL   RDW 41.2 87.8 - 67.6 %   Platelets 460 (H) 150 - 400 K/uL   nRBC 0.0 0.0 - 0.2 %  Type and screen     Status: None   Collection Time: 02/01/22 12:52 AM  Result Value Ref Range   ABO/RH(D) O POS     Antibody Screen NEG    Sample Expiration      02/04/2022,2359 Performed at [facility], [address] [place], [place]     Imaging:  [id number] [place] Comp + 14 Wk Patient Name: Nicole Leonard DOB: 03-24-1996 MRN: [id number]  ULTRASOUND REPORT  Location: [facility] Date of Service: 11/28/2021   Indications:Anatomy Ultrasound   **Study is suboptimal secondary to maternal body habitus**  Findings:  Singleton intrauterine pregnancy is visualized with FHR at 160 BPM.  Biometrics give an (U/S) Gestational age of [redacted]w[redacted]d and an (U/S) EDD of  04/05/22; this correlates with the clinically established Estimated Date of  Delivery: 04/01/22   Fetal presentation is Variable.  EFW: 14oz. Placenta: posterior. Grade: 0 AFI: subjectively normal.  Anatomic survey is complete and normal; Gender - female.    Ovaries not visualized. Survey of the adnexa demonstrates no adnexal masses.  There is no free peritoneal fluid in the cul de sac.  Impression: 1. [redacted]w[redacted]d Viable Singleton Intrauterine pregnancy by U/S. 2. (U/S) EDD is consistent with Clinically established Estimated Date of  Delivery: 04/01/22 . 3. Normal Anatomy Scan  Recommendations: 1.Clinical correlation with the patient's History and Physical Exam.  Sheralyn Boatman  Henderson-Gainey   The ultrasound images and findings were reviewed by me and I agree with  the above report.  Elonda Husky, M.D. 11/30/2021 7:52 AM     Patient Name: Kimble Delaurentis Ohms DOB: [birth year] MRN: [id number]  ULTRASOUND REPORT  Location: [facility] Date of Service: 01/30/2022   Indications:growth/afi; No fetal heart tones heard in Dr appt just now. Findings:  [place] intrauterine pregnancy is visualized with FHR not detected.   Biometrics give an (U/S) Gestational age of [redacted]w[redacted]d and an (U/S) EDD of 05/09/22;  this does not correlate with the clinically established Estimated Date of Delivery: 04/01/22.   Fetal  presentation is Cephalic.  Placenta: posterior.   EFW: 891g / 1lb15oz.  Impression: 1. Fetal demise occurred in the last two weeks since patient was seen here on 01/17/22 and fetal heart tones were detected at 142 BPM.  Recommendations: 1.Clinical correlation with the patient's History and Physical Exam.  Sheralyn Boatman  Henderson-Gainey   Assessment : Willistine Ferrall is a 26 y.o. G1P0000 at [redacted]w[redacted]d being admitted for induction of labor due to IUFD.  Gestational HTN, morbid obesity (BMI 55), perinatal depression this pregnancy.  Plan: Labor: Induction with Cytotec as ordered as per protocol. Analgesia as needed. Continue anti-hypertensive regimen with newly adjusted dose.   FWB: IUFD.  Will monitor tocometry.  Delivery plan: Anticipate vaginal delivery    Hildred Laser, MD [facility]

## 2022-02-01 NOTE — Progress Notes (Signed)
Intrapartum Progress Note  S: Patient resting comfortably. Notes she is alternating between cold and hot.    O: Blood pressure 131/77, pulse (!) 106, temperature 98.1 F (36.7 C), temperature source Oral, resp. rate 17, height 5\' 4"  (1.626 m), weight (!) 146 kg, last menstrual period 07/03/2021. Gen App: NAD, comfortable Abdomen: soft, gravid FHT:not obtained.  Tocometer: none detectable.  Cervix:  0.5/40/-3.  Tablets of Cytotec again retrieved from vaginal canal partially dissolved.  Extremities: Nontender, no edema.  Labs:  No new labs  Assessment:  1: SIUP at [redacted]w[redacted]d 2. IUFD at 31 weeks with suspected IUGR 3. Gestational HTN 4. Morbid obesity 5. Perinatal depression  Plan:  1. Continue IOL with Cytotec.  Patient changed to buccal route. Administered 200 mcg.  Foley bulb placed. Following this, will decrease back to 50-100 mcg.  2.  Essential hypertension, currently on labetalol and HCTZ.Controlled.  4.  Perinatal depression, patient currently ok for now.    [redacted]w[redacted]d, MD [facility]

## 2022-02-01 NOTE — Progress Notes (Signed)
Intrapartum Progress Note  S: Patient resting comfortably. No complaints.   O: Blood pressure (!) 145/91, pulse 98, temperature 98.1 F (36.7 C), temperature source Oral, resp. rate 17, height 5\' 4"  (1.626 m), weight (!) 146 kg, last menstrual period 07/03/2021. Gen App: NAD, comfortable Abdomen: soft, gravid FHT:not obtained.  Tocometer: none detectable.  Cervix:  0.5/40/-3.  Tablet of Cytotec retrieved from vaginal canal un-dissolved.  Extremities: Nontender, no edema.  Labs:  No new labs  Assessment:  1: SIUP at [redacted]w[redacted]d 2. IUFD at 31 weeks with suspected IUGR 3. Gestational HTN 4. Morbid obesity 5. Perinatal depression  Plan:  1. Continue IOL with Cytotec.  Placed un-dissolved dose of Cytotec and new 200 mcg dose in vagina with small amount of saline.  2.  Essential hypertension, currently on labetalol and HCTZ. 4.  Perinatal depression, patient currently ok for now.    [redacted]w[redacted]d, MD [facility]

## 2022-02-01 NOTE — Progress Notes (Signed)
Intrapartum Progress Note  S: Patient notes some cramping, otherwise feels ok.   O: Blood pressure (!) 145/91, pulse 98, temperature 98.1 F (36.7 C), temperature source Oral, resp. rate 17, height 5\' 4"  (1.626 m), weight (!) 146 kg, last menstrual period 07/03/2021. Gen App: NAD, comfortable Abdomen: soft, gravid FHT:not obtained.  Tocometer: uterine irritability Cervix:  not examined Extremities: Nontender, no edema.  Labs:  No new labs  Assessment:  1: SIUP at [redacted]w[redacted]d 2. IUFD at 31 weeks with suspected IUGR 3. Gestational HTN 4. Morbid obesity 5. Perinatal depression  Plan:  1. Continue IOL with Cytotec.  Patient received 2 doses of 50 mcg of Cytotec followed by a dose of 200 mcg of Cytotec.  Due for next check in approximately 1 hour. 2.  Essential hypertension, currently on labetalol and HCTZ. 4.  Perinatal depression, offered patient the option of speaking with a counselor or chaplain services during her induction or postpartum.  Patient declines at this time however notes she is open at a later time.  Patient also asked about causes for current demise.  I discussed that she did have several risk factors including obesity and her hypertension, however definitively could not say without an autopsy which could be offered to her once her baby is born.   [redacted]w[redacted]d, MD [facility]

## 2022-02-02 ENCOUNTER — Encounter: Payer: Self-pay | Admitting: Obstetrics and Gynecology

## 2022-02-02 DIAGNOSIS — F1211 Cannabis abuse, in remission: Secondary | ICD-10-CM

## 2022-02-02 DIAGNOSIS — O364XX0 Maternal care for intrauterine death, not applicable or unspecified: Secondary | ICD-10-CM

## 2022-02-02 DIAGNOSIS — F329 Major depressive disorder, single episode, unspecified: Secondary | ICD-10-CM

## 2022-02-02 DIAGNOSIS — O99344 Other mental disorders complicating childbirth: Secondary | ICD-10-CM

## 2022-02-02 DIAGNOSIS — O134 Gestational [pregnancy-induced] hypertension without significant proteinuria, complicating childbirth: Secondary | ICD-10-CM

## 2022-02-02 DIAGNOSIS — O99214 Obesity complicating childbirth: Secondary | ICD-10-CM

## 2022-02-02 DIAGNOSIS — O99324 Drug use complicating childbirth: Secondary | ICD-10-CM

## 2022-02-02 DIAGNOSIS — Z3A31 31 weeks gestation of pregnancy: Secondary | ICD-10-CM

## 2022-02-02 MED ORDER — BENZOCAINE-MENTHOL 20-0.5 % EX AERO: 1.0000 | INHALATION_SPRAY | CUTANEOUS | Status: DC | PRN

## 2022-02-02 MED ORDER — NALBUPHINE HCL 10 MG/ML IJ SOLN
INTRAMUSCULAR | Status: AC
  Administered 2022-02-02: 10 mg via INTRAVENOUS
  Filled 2022-02-02: qty 1

## 2022-02-02 MED ORDER — DIPHENHYDRAMINE HCL 25 MG PO CAPS: 25.0000 mg | ORAL_CAPSULE | Freq: Four times a day (QID) | ORAL | Status: DC | PRN

## 2022-02-02 MED ORDER — BUTORPHANOL TARTRATE 2 MG/ML IJ SOLN
2.0000 mg | INTRAMUSCULAR | Status: DC | PRN
  Administered 2022-02-02 (×2): 2 mg via INTRAVENOUS
  Filled 2022-02-02: qty 1

## 2022-02-02 MED ORDER — OXYTOCIN-SODIUM CHLORIDE 30-0.9 UT/500ML-% IV SOLN
1.0000 m[IU]/min | INTRAVENOUS | Status: DC
  Administered 2022-02-02: 2 m[IU]/min via INTRAVENOUS

## 2022-02-02 MED ORDER — NALBUPHINE HCL 10 MG/ML IJ SOLN: 10.0000 mg | INTRAMUSCULAR | Status: DC | PRN

## 2022-02-02 MED ORDER — ZOLPIDEM TARTRATE 5 MG PO TABS: 5.0000 mg | ORAL_TABLET | Freq: Every evening | ORAL | Status: DC | PRN

## 2022-02-02 MED ORDER — SIMETHICONE 80 MG PO CHEW: 80.0000 mg | CHEWABLE_TABLET | ORAL | Status: DC | PRN

## 2022-02-02 MED ORDER — ACETAMINOPHEN 325 MG PO TABS: 650.0000 mg | ORAL_TABLET | ORAL | Status: DC | PRN

## 2022-02-02 MED ORDER — IBUPROFEN 600 MG PO TABS
600.0000 mg | ORAL_TABLET | Freq: Four times a day (QID) | ORAL | Status: DC
  Administered 2022-02-02: 600 mg via ORAL
  Filled 2022-02-02: qty 1

## 2022-02-02 MED ORDER — DOCUSATE SODIUM 100 MG PO CAPS: 100.0000 mg | ORAL_CAPSULE | Freq: Two times a day (BID) | ORAL | Status: DC

## 2022-02-02 MED ORDER — OXYCODONE-ACETAMINOPHEN 5-325 MG PO TABS: 1.0000 | ORAL_TABLET | ORAL | Status: DC | PRN

## 2022-02-02 MED ORDER — OXYTOCIN-SODIUM CHLORIDE 30-0.9 UT/500ML-% IV SOLN: 2.5000 [IU]/h | INTRAVENOUS | Status: DC | PRN

## 2022-02-02 NOTE — Progress Notes (Signed)
Discharge home. Left floor via wheelchair. Flowers given. Left floor with significant other and her mother. Follow-up appointment scheduled for June 28th. Nicole Leonard

## 2022-02-02 NOTE — Discharge Summary (Signed)
    L&D OB Triage Note  SUBJECTIVE Nicole Leonard is a 26 y.o. G67P0100 female at [redacted]w[redacted]d, EDD Estimated Date of Delivery: 04/01/22 who presented to triage after being diagnosed with an IUFD at 31 weeks.  She was noted to have severe range blood pressures in the office and is being sent to labor and delivery for follow-up of these pressures and preeclamptic labs. (She is a known chronic hypertensive on labetalol and HCTZ.)  OB History  Gravida Para Term Preterm AB Living  1 1 0 1 0 0  SAB IAB Ectopic Multiple Live Births  0 0 0 0 0    # Outcome Date GA Lbr Len/2nd Weight Sex Delivery Anes PTL Lv  1 Preterm 02/02/22 [redacted]w[redacted]d / 00:06 720 g M Vag-Spont None  FD     Name: Nicole Leonard FD     Apgar1: 0  Apgar5: 0    Medications Prior to Admission  Medication Sig Dispense Refill Last Dose   aspirin EC 81 MG tablet Take 81 mg by mouth daily. Swallow whole.   01/30/2022   hydrochlorothiazide (HYDRODIURIL) 12.5 MG tablet Take 1 tablet (12.5 mg total) by mouth daily. 30 tablet 1 01/30/2022   labetalol (NORMODYNE) 200 MG tablet Take 1 tablet (200 mg total) by mouth 2 (two) times daily. 60 tablet 3 01/30/2022   Prenatal Vit-Fe Fumarate-FA (PRENATAL PO) Take by mouth.   01/30/2022   buPROPion (WELLBUTRIN XL) 150 MG 24 hr tablet Take 1 tablet (150 mg total) by mouth daily. (Patient not taking: Reported on 01/30/2022) 90 tablet 3 Not Taking     OBJECTIVE  Nursing Evaluation:   BP 136/77   Pulse 98   Temp 98.3 F (36.8 C) (Oral)   Resp 20   Ht 5\' 4"  (1.626 m)   Wt (!) 145.6 kg   LMP 07/03/2021 (Exact Date) Comment: lighter than normal  BMI 55.10 kg/m    Findings:        No severe range pressures noted in L&D.     Normal preeclamptic labs.      NST was performed and has been reviewed by me.  NST INTERPRETATION:                 Contraction Frequency (min): none noted  ASSESSMENT Impression:  1.  Pregnancy:  G1P0100 at [redacted]w[redacted]d , EDD Estimated Date of Delivery: 04/01/22 2.  No  evidence of severe range blood pressures or preeclampsia noted.  PLAN 1. Current condition and above findings reviewed.   2. Discharge home with standard labor precautions given to return to L&D or call the office for problems. 3. She is scheduled for induction tomorrow.

## 2022-02-02 NOTE — Progress Notes (Signed)
Intrapartum Progress Note  S: Patient called out, feeling lots of pain. Tearful.   O: Blood pressure (!) 141/72, pulse (!) 109, temperature 98.1 F (36.7 C), temperature source Oral, resp. rate 17, height 5\' 4"  (1.626 m), weight (!) 146 kg, last menstrual period 07/03/2021. Gen App: NAD, uncomfortable Abdomen: soft, gravid FHT:not obtained.  Tocometer: not tracing Cervix:  4.5/60-70/-3.  Hour-glassing of membranes palpable.  Extremities: Nontender, no edema.  Pitocin: 20 mIU  Labs:  No new labs  Assessment:  1: SIUP at [redacted]w[redacted]d 2. IUFD at 31 weeks with suspected IUGR 3. Gestational HTN 4. Morbid obesity 5. Perinatal depression  Plan:  1.  Continue Pitocin.  Will change IV pain meds from Stadol to Demerol. Still hesitant about receiving epidural.  2.  Essential hypertension, currently on labetalol and HCTZ.Controlled.    [redacted]w[redacted]d, MD [facility]

## 2022-02-02 NOTE — Progress Notes (Signed)
Intrapartum Progress Note  S: Patient feeling pain with contractions and pressure.   O: Blood pressure (!) 146/97, pulse 95, temperature 98.1 F (36.7 C), temperature source Oral, resp. rate 17, height 5\' 4"  (1.626 m), weight (!) 146 kg, last menstrual period 07/03/2021. Gen App: NAD, uncomfortable Abdomen: soft, gravid FHT:not obtained.  Tocometer: not tracing Cervix:  3/50-60/-3.  Tablets of Cytotec again retrieved from vaginal canal partially dissolved.  Extremities: Nontender, no edema.  Labs:  No new labs  Assessment:  1: SIUP at [redacted]w[redacted]d 2. IUFD at 31 weeks with suspected IUGR 3. Gestational HTN 4. Morbid obesity 5. Perinatal depression  Plan:  1.  Foley bulb placed expelled, cervix with some change. Patient contracting regularly, will initiate Pitocin. Offered pain medication, patient ok to receive IV pain meds.   2.  Essential hypertension, currently on labetalol and HCTZ.Controlled.  4.  Perinatal depression, patient currently ok for now. Patient's mother and partner in room for support.    [redacted]w[redacted]d, MD [facility]

## 2022-02-02 NOTE — Progress Notes (Signed)
02/02/22 1850:Fetal remains were taken to morgue and placed in cooler. Called Lorelle Formosa at [facility] cre[facility] to make them aware of fetal remains to be released to [place].

## 2022-02-02 NOTE — Discharge Summary (Signed)
Postpartum Discharge Summary  Date of Service 02/02/2022     Patient Name: Nicole Leonard DOB: 1996-01-24 MRN: [id number]  Date of admission: 02/01/2022 Delivery date:02/02/2022  Delivering provider: Lurlean Horns  Date of discharge: 02/02/2022  Admitting diagnosis: IUFD at 64 weeks or more of gestation [O36.4XX0] Intrauterine pregnancy: [redacted]w[redacted]d    Secondary diagnosis:  Principal Problem:   IUFD at 232 weeksor more of gestation  Additional problems: chronic hyertension, gestational diabetes    Discharge diagnosis:  fetal demise at 365 weeks, delivered                                               Post partum procedures: NA Augmentation: Pitocin, Cytotec, and IP Foley Complications: 30 week IUFD  Hospital course: Induction of Labor With Vaginal Delivery   26y.o. yo G1P0100 at 372w5das admitted to the hospital 02/01/2022 for induction of labor.  Indication for induction:  IUFD .  Patient had an uncomplicated labor course as follows: Membrane Rupture Time/Date:  ,uncertain.   Delivery Method:Vaginal, Spontaneous  Episiotomy: None  Lacerations:  None  Details of delivery can be found in separate delivery note.  Patient had a routine postpartum course. Patient is discharged home 02/02/22.  Newborn Data: Birth date:02/02/2022  Birth time:7:31 AM  Gender:Female  Living status:Fetal Demise  Apgars:0 ,0  Weight:720 g   Magnesium Sulfate received: No BMZ received: No Rhophylac:N/A MMR:No T-DaP:Given prenatally Flu: No Transfusion:No  Physical exam  Vitals:   02/02/22 1229 02/02/22 1257 02/02/22 1258 02/02/22 1412  BP: (!) 146/92 (!) 146/92  131/80  Pulse: (!) 104 (!) 104  97  Resp:      Temp:   98.2 F (36.8 C)   TempSrc:   Oral   Weight:      Height:       General: cooperative and no distress, grieving appropriately Lochia: appropriate Uterine Fundus: firm Incision: N/A DVT Evaluation: No evidence of DVT seen on physical exam. Labs: Lab Results   Component Value Date   WBC 12.9 (H) 02/01/2022   HGB 13.8 02/01/2022   HCT 41.8 02/01/2022   MCV 89.7 02/01/2022   PLT 460 (H) 02/01/2022      Latest Ref Rng & Units 01/30/2022   12:17 PM  CMP  Glucose 70 - 99 mg/dL 110   BUN 6 - 20 mg/dL 7   Creatinine 0.44 - 1.00 mg/dL 0.63   Sodium 135 - 145 mmol/L 136   Potassium 3.5 - 5.1 mmol/L 4.3   Chloride 98 - 111 mmol/L 106   CO2 22 - 32 mmol/L 21   Calcium 8.9 - 10.3 mg/dL 9.6   Total Protein 6.5 - 8.1 g/dL 7.2   Total Bilirubin 0.3 - 1.2 mg/dL 0.9   Alkaline Phos 38 - 126 U/L 97   AST 15 - 41 U/L 22   ALT 0 - 44 U/L 32    Edinburgh Score:     No data to display            After visit meds:  Allergies as of 02/02/2022       Reactions   Lavender Oil Hives   Penicillins Other (See Comments)   Unknown, was as child        Medication List     TAKE these medications    aspirin  EC 81 MG tablet Take 81 mg by mouth daily. Swallow whole.   buPROPion 150 MG 24 hr tablet Commonly known as: Wellbutrin XL Take 1 tablet (150 mg total) by mouth daily.   hydrochlorothiazide 12.5 MG tablet Commonly known as: HYDRODIURIL Take 1 tablet (12.5 mg total) by mouth daily.   labetalol 200 MG tablet Commonly known as: NORMODYNE Take 1 tablet (200 mg total) by mouth 2 (two) times daily.   PRENATAL PO Take by mouth.         Discharge home in stable condition [place] Discharge instruction: per After Visit Summary and Postpartum booklet. Activity: Advance as tolerated. Pelvic rest for 6 weeks.  Diet: carb modified diet Anticipated Birth Control: Nexplanon Postpartum Appointment:2 weeks Additional Postpartum F/U: Postpartum Depression checkup and Nexplanon placement Future Appointments: Future Appointments  Date Time Provider [place]  02/14/2022 10:30 AM Rubie Maid, MD [facility] None   Follow up Visit:  Follow-up Information     Rubie Maid, MD Follow up in 2 week(s).    Specialties: Obstetrics and Gynecology, Radiology Why: You have an appointment set for June28 th with Dr. Marcelline Mates. If you plan on having  a Nexplanon placed at that visit, then pleas contact the office and ask whether this can be done on that day Contact information: [place] [address] [phone]                     02/02/2022 Imagene Riches, CNM

## 2022-02-06 LAB — SURGICAL PATHOLOGY

## 2022-02-12 NOTE — Progress Notes (Deleted)
     History of Present Illness:   Nicole Leonard is a 26 y.o. G60P0100 female who presents for a 2 week televisit for mood check. She is 2 weeks postpartum following a spontaneous vaginal delivery.  The delivery was at 31 gestational weeks.  Postpartum course has been well so far.  Nicole Leonard birthed a non-viable female infant Bleeding: ***. Postpartum depression screening: {Desc; negative/positive:13464}.  EDPS score is ***.      The following portions of the patient's history were reviewed and updated as appropriate: allergies, current medications, past family history, past medical history, past social history, past surgical history, and problem list.   Observations/Objective:   Last menstrual period 07/03/2021. Gen App: NAD Psych: normal speech, affect. Good mood.         No data to display               Assessment and Plan:   1. Encounter for screening for maternal depression - Screening {FINDINGS; POSITIVE [phone]} today. Will rescreen at 6 week postpartum visit. Overall doing well.    2. Postpartum state - Overall doing well. Continue routine postpartum home care.    3. Lactating mother -    Follow Up Instructions:     I discussed the assessment and treatment plan with the patient. The patient was provided an opportunity to ask questions and all were answered. The patient agreed with the plan and demonstrated an understanding of the instructions.   The patient was advised to call back or seek an in-person evaluation if the symptoms worsen or if the condition fails to improve as anticipated.   I provided *** minutes of non-face-to-face time during this encounter.     Hildred Laser, MD [facility]   GYNECOLOGY OFFICE PROCEDURE NOTE  Nicole Leonard is a 26 y.o. G1P0100 here for Nexplanon insertion.  Last pap smear was on *** and was normal.  No other gynecologic concerns.  Nexplanon Insertion Procedure Patient identified, informed consent  performed, consent signed.   Patient does understand that irregular bleeding is a very common side effect of this medication. She was advised to have backup contraception for one week after placement. Pregnancy test in clinic today was negative.  Appropriate time out taken.  Patient's left arm was prepped and draped in the usual sterile fashion. The ruler used to measure and mark insertion area.  Patient was prepped with alcohol swab and then injected with 3 ml of 1% lidocaine.  She was prepped with betadine, Nexplanon removed from packaging,  Device confirmed in needle, then inserted full length of needle and withdrawn per handbook instructions. Nexplanon was able to palpated in the patient's arm; patient palpated the insert herself. There was minimal blood loss.  Patient insertion site covered with guaze and a pressure bandage to reduce any bruising.  The patient tolerated the procedure well and was given post procedure instructions.      Hildred Laser, MD [facility]

## 2022-02-14 ENCOUNTER — Other Ambulatory Visit: Payer: Medicaid Other

## 2022-02-14 ENCOUNTER — Encounter: Payer: Medicaid Other | Admitting: Obstetrics and Gynecology

## 2022-02-14 NOTE — Telephone Encounter (Signed)
Done. She is coming in on Friday @ 2:45.

## 2022-02-15 NOTE — Progress Notes (Unsigned)
OBSTETRICS POSTPARTUM CLINIC PROGRESS NOTE  Subjective:     Nicole Leonard is a 26 y.o. G49P0100 female who presents for a postpartum visit. She is 2 weeks postpartum following a {delivery:12449}. The delivery was at 31.5 gestational weeks.  ale infant was non-viable. Bleeding: Very Light. Postpartum depression screening: negative.  EDPS score is 14.     The following portions of the patient's history were reviewed and updated as appropriate: allergies, current medications, past family history, past medical history, past social history, past surgical history, and problem list.  Review of Systems {ros; complete:30496}   Objective:    BP (!) 157/92   Pulse (!) 128   Resp 16   Ht 5\' 4"  (1.626 m)   Wt (!) 316 lb 6.4 oz (143.5 kg)   LMP 07/03/2021 (Exact Date) Comment: lighter than normal  BMI 54.31 kg/m   General:  alert and no distress   Breasts:  inspection negative, no nipple discharge or bleeding, no masses or nodularity palpable  Lungs: clear to auscultation bilaterally  Heart:  regular rate and rhythm, S1, S2 normal, no murmur, click, rub or gallop  Abdomen: soft, non-tender; bowel sounds normal; no masses,  no organomegaly.  ***Well healed Pfannenstiel incision   Vulva:  normal  Vagina: normal vagina, no discharge, exudate, lesion, or erythema  Cervix:  no cervical motion tenderness and no lesions  Corpus: normal size, contour, position, consistency, mobility, non-tender  Adnexa:  normal adnexa and no mass, fullness, tenderness  Rectal Exam: Not performed.         Labs:  Lab Results  Component Value Date   HGB 13.8 02/01/2022     Assessment:   No diagnosis found.   Plan:    1. Contraception: Nexplanon 2. Will check Hgb for h/o postpartum anemia of less than 10.  3. Follow up in: {1-10:13787} {time; units:19136} or as needed.         GYNECOLOGY OFFICE PROCEDURE NOTE  Nicole Leonard is a 26 y.o. G1P0100 here for Nexplanon removal*** Nexplanon  insertion.  Last pap smear was on *** and was normal.  No other gynecologic concerns.  Nexplanon Insertion Procedure Patient identified, informed consent performed, consent signed.   Patient does understand that irregular bleeding is a very common side effect of this medication. She was advised to have backup contraception for one week after placement. Pregnancy test in clinic today was negative.  Appropriate time out taken.  Patient's left arm was prepped and draped in the usual sterile fashion. The ruler used to measure and mark insertion area.  Patient was prepped with alcohol swab and then injected with 3 ml of 1% lidocaine.  She was prepped with betadine, Nexplanon removed from packaging,  Device confirmed in needle, then inserted full length of needle and withdrawn per handbook instructions. Nexplanon was able to palpated in the patient's arm; patient palpated the insert herself. There was minimal blood loss.  Patient insertion site covered with guaze and a pressure bandage to reduce any bruising.  The patient tolerated the procedure well and was given post procedure instructions.   Nexplanon Removal Patient identified, informed consent performed, consent signed.   Appropriate time out taken. Nexplanon site identified.  Area prepped in usual sterile fashon. One ml of 1% lidocaine was used to anesthetize the area at the distal end of the implant. A small stab incision was made right beside the implant on the distal portion.  The Nexplanon rod was grasped using hemostats and removed without  difficulty.  There was minimal blood loss. There were no complications.  3 ml of 1% lidocaine was injected around the incision for post-procedure analgesia.  Steri-strips were applied over the small incision.  A pressure bandage was applied to reduce any bruising.  The patient tolerated the procedure well and was given post procedure instructions.  Patient is planning to use *** for contraception/attempt  conception.  Nexplanon Removal and Reinsertion Patient identified, informed consent performed, consent signed.   Patient does understand that irregular bleeding is a very common side effect of this medication. She was advised to have backup contraception for one week after replacement of the implant. Pregnancy test in clinic today was negative.  Appropriate time out taken. Nexplanon site identified in left arm.  Area prepped in usual sterile fashon. One ml of 1% lidocaine was used to anesthetize the area at the distal end of the implant. A small stab incision was made right beside the implant on the distal portion. The Nexplanon rod was grasped using hemostats and removed without difficulty. There was minimal blood loss. There were no complications. Area was then injected with 3 ml of 1 % lidocaine. She was re-prepped with betadine, Nexplanon removed from packaging, Device confirmed in needle, then inserted full length of needle and withdrawn per handbook instructions. Nexplanon was able to palpated in the patient's arm; patient palpated the insert herself.  There was minimal blood loss. Patient insertion site covered with gauze and a pressure bandage to reduce any bruising. The patient tolerated the procedure well and was given post procedure instructions.  She was advised to have backup contraception for one week.      Jaynie Collins, MD, FACOG Obstetrician & Gynecologist, [facility] for [facility], [facility]    Lot: [id number] Exp: 01/06/2024  Hildred Laser, MD [facility]

## 2022-02-16 ENCOUNTER — Encounter: Payer: Self-pay | Admitting: Obstetrics and Gynecology

## 2022-02-16 ENCOUNTER — Ambulatory Visit (INDEPENDENT_AMBULATORY_CARE_PROVIDER_SITE_OTHER): Payer: BC Managed Care – PPO | Admitting: Obstetrics and Gynecology

## 2022-02-16 DIAGNOSIS — Z8759 Personal history of other complications of pregnancy, childbirth and the puerperium: Secondary | ICD-10-CM

## 2022-02-16 DIAGNOSIS — F53 Postpartum depression: Secondary | ICD-10-CM | POA: Diagnosis not present

## 2022-02-16 DIAGNOSIS — O99345 Other mental disorders complicating the puerperium: Secondary | ICD-10-CM | POA: Diagnosis not present

## 2022-02-16 DIAGNOSIS — Z30017 Encounter for initial prescription of implantable subdermal contraceptive: Secondary | ICD-10-CM | POA: Diagnosis not present

## 2022-02-16 DIAGNOSIS — O364XX0 Maternal care for intrauterine death, not applicable or unspecified: Secondary | ICD-10-CM

## 2022-02-16 MED ORDER — AMLODIPINE BESYLATE 5 MG PO TABS: 5.0000 mg | ORAL_TABLET | Freq: Every day | ORAL | 3 refills | Status: DC

## 2022-02-16 NOTE — Patient Instructions (Signed)
NEXPLANON PLACEMENT POST-PROCEDURE INSTRUCTIONS  You may take Ibuprofen, Aleve or Tylenol for pain if needed.  Pain should resolve within in 24 hours.  You may have intercourse after 24 hours.  If you using this for birth control, it is effective immediately.  You need to call if you have any fever, heavy bleeding, or redness at insertion site. Irregular bleeding is common the first several months after having a Nexplanonplaced. You do not need to call for this reason unless you are concerned.  Shower or bathe as normal.  You can remove the bandage after 24 hours.  

## 2022-02-26 MED ORDER — ETONOGESTREL 68 MG ~~LOC~~ IMPL
68.0000 mg | DRUG_IMPLANT | Freq: Once | SUBCUTANEOUS | Status: AC
  Administered 2022-02-16: 68 mg via SUBCUTANEOUS

## 2022-02-26 NOTE — Addendum Note (Signed)
Addended by: Tommie Raymond on: 02/26/2022 03:14 PM   Modules accepted: Orders

## 2022-03-23 ENCOUNTER — Encounter: Payer: BC Managed Care – PPO | Admitting: Obstetrics and Gynecology

## 2022-04-04 NOTE — Progress Notes (Signed)
GYNECOLOGY PROGRESS NOTE  Subjective:    Patient ID: Nicole Leonard, female    DOB: [birth year], 26 y.o.   MRN: [id number]  HPI  Patient is a 26 y.o. G33P0100 female who presents for follow up on Nexplanon and BP check. She had nexplanon inserted on 02/16/2022. She has no concerns with her Nexplanon. She continues to take her Amlodipine as prescribed. She did not like how the Wellbutrin made her feel so she stopped taking that.  Notes that she is just trying to cope naturally.  Most days are good but still has an occasional sad day.   The following portions of the patient's history were reviewed and updated as appropriate: allergies, current medications, past family history, past medical history, past social history, past surgical history, and problem list.  Review of Systems Pertinent items are noted in HPI.   Objective:   Blood pressure 127/83, pulse 87, resp. rate 16, height 5\' 4"  (1.626 m), weight (!) 317 lb 1.6 oz (143.8 kg), last menstrual period 02/16/2022, not currently breastfeeding.  Body mass index is 54.43 kg/m.  General appearance: alert and no distress Extremities: right upper extremity with Nexplanon slightly palpable, no tenderness at site. Neurologic: Grossly normal Psych: mildly depressed mood, sad affect.      04/06/2022   10:46 AM 01/17/2022    9:21 AM 09/07/2021    8:58 AM  GAD 7 : Generalized Anxiety Score  Nervous, Anxious, on Edge 1 2 2   Control/stop worrying 2 2 2   Worry too much - different things 2 2 2   Trouble relaxing 1 1 1   Restless 1 1 0  Easily annoyed or irritable 2 0 2  Afraid - awful might happen 3 0 2  Total GAD 7 Score 12 8 11   Anxiety Difficulty Somewhat difficult Somewhat difficult Not difficult at all        04/06/2022   10:45 AM 01/17/2022    9:20 AM 09/07/2021    8:58 AM 08/16/2021    6:12 PM 03/15/2021    8:56 AM  Depression screen PHQ 2/9  Decreased Interest 1 2 1  0 1  Down, Depressed, Hopeless 2 1 0 0 1  PHQ - 2 Score 3 3 1   0 2  Altered sleeping 0 3 1  1   Tired, decreased energy 1 2 1  1   Change in appetite 1 2 1  1   Feeling bad or failure about yourself  2 1 0  1  Trouble concentrating 1 2 1   0  Moving slowly or fidgety/restless 0 0 0  0  Suicidal thoughts 1 0 0  0  PHQ-9 Score 9 13 5  6   Difficult doing work/chores Somewhat difficult Somewhat difficult Not difficult at all  Not difficult at all     Assessment:   1. History of gestational hypertension   2. Postpartum depression   3. History of perinatal fetal loss   4. Nexplanon in place      Plan:   History of GHTN, currently on Amlodipine (changed from Labetalol and HCTZ during pregnancy), much better controlled today. Will remain on medication for at least 6 months postpartum as BPs were still significantly elevated weeks after delivery. May have cHTN.  Postpartum depression and anxiety noted on today's exam. Some may be associated with grief from perinatal loss. Did not like Wellbutrin. Discussed other medication options, patient notes she does not desire to be on medications at this time. Discussed natural OTC remedies for mood (Ashwaganda/SAM-E).  Also given resources for support groups for perinatal loss (previously declined individual counseling).  Nexplanon in place, no issues. Advised that she can keep in place for 3 years, or until plans for future conception.   To f/u as needed.    A total of 21 minutes were spent face-to-face with the patient during this encounter and over half of that time involved counseling and coordination of care.   Hildred Laser, MD [facility]

## 2022-04-06 ENCOUNTER — Ambulatory Visit (INDEPENDENT_AMBULATORY_CARE_PROVIDER_SITE_OTHER): Payer: BC Managed Care – PPO | Admitting: Obstetrics and Gynecology

## 2022-04-06 ENCOUNTER — Encounter: Payer: Self-pay | Admitting: Obstetrics and Gynecology

## 2022-04-06 VITALS — BP 127/83 | HR 87 | Resp 16 | Ht 64.0 in | Wt 317.1 lb

## 2022-04-06 DIAGNOSIS — F53 Postpartum depression: Secondary | ICD-10-CM

## 2022-04-06 DIAGNOSIS — Z975 Presence of (intrauterine) contraceptive device: Secondary | ICD-10-CM

## 2022-04-06 DIAGNOSIS — Z8759 Personal history of other complications of pregnancy, childbirth and the puerperium: Secondary | ICD-10-CM | POA: Diagnosis not present

## 2022-04-08 ENCOUNTER — Encounter: Payer: Self-pay | Admitting: Emergency Medicine

## 2022-04-08 ENCOUNTER — Other Ambulatory Visit: Payer: Self-pay

## 2022-04-08 ENCOUNTER — Emergency Department
Admission: EM | Admit: 2022-04-08 | Discharge: 2022-04-08 | Disposition: A | Discharge status: Home / Self Care | Payer: BC Managed Care – PPO | Attending: Emergency Medicine | Admitting: Emergency Medicine

## 2022-04-08 DIAGNOSIS — K0889 Other specified disorders of teeth and supporting structures: Secondary | ICD-10-CM | POA: Diagnosis not present

## 2022-04-08 DIAGNOSIS — F17210 Nicotine dependence, cigarettes, uncomplicated: Secondary | ICD-10-CM | POA: Insufficient documentation

## 2022-04-08 DIAGNOSIS — I10 Essential (primary) hypertension: Secondary | ICD-10-CM | POA: Insufficient documentation

## 2022-04-08 DIAGNOSIS — K047 Periapical abscess without sinus: Secondary | ICD-10-CM | POA: Diagnosis not present

## 2022-04-08 MED ORDER — IBUPROFEN 800 MG PO TABS: 800.0000 mg | ORAL_TABLET | Freq: Three times a day (TID) | ORAL | 0 refills | Status: DC | PRN

## 2022-04-08 MED ORDER — CLINDAMYCIN HCL 300 MG PO CAPS: 300.0000 mg | ORAL_CAPSULE | Freq: Three times a day (TID) | ORAL | 0 refills | Status: AC

## 2022-04-08 MED ORDER — IBUPROFEN 600 MG PO TABS
600.0000 mg | ORAL_TABLET | Freq: Once | ORAL | Status: AC
  Administered 2022-04-08: 600 mg via ORAL
  Filled 2022-04-08: qty 1

## 2022-04-08 NOTE — Discharge Instructions (Signed)
You will need to follow-up with a dentist.  Begin taking the antibiotic as directed until you are completely finished.  Ibuprofen as needed for pain and you may also add Tylenol with this if additional pain medication is needed.  OPTIONS FOR DENTAL FOLLOW UP CARE  [place] [facility] of Health and Human Services - Local Safety Net Dental Clinics [email or web address]   [facility] [phone])  [facility] [phone])  [facility] [phone])  [facility] [phone])  [facility] [phone]) This clinic caters to the indigent population and is on a lottery system. Location: [employer] [facility] Dentistry, [employer], [address] [place] Clinic Hours: Wednesdays from 6pm - 9pm, patients seen by a lottery system. For dates, call or go to [email or web address] Services: Cleanings, fillings and simple extractions. Payment Options: DENTAL WORK IS FREE OF CHARGE. Bring proof of income or support. Best way to get seen: Arrive at 5:15 pm - this is a lottery, NOT first come/first serve, so arriving earlier will not increase your chances of being seen.     [facility] [phone] Select option 1 for emergencies   Location: [facility], [place], [address], [place] Clinic Hours: No walk-ins accepted - call the day before to schedule an appointment. Check in times are 9:30 am and 1:30 pm. Services: Simple extractions, temporary fillings, pulpectomy/pulp debridement, uncomplicated abscess drainage. Payment Options: PAYMENT IS DUE AT THE TIME OF SERVICE.  Fee is usually $100-200, additional surgical procedures (e.g. abscess drainage) may be extra. Cash, checks, Visa/MasterCard accepted.  Can file Medicaid if patient is covered for dental - patient should call case worker to check. No  discount for [facility] patients. Best way to get seen: MUST call the day before and get onto the schedule. Can usually be seen the next 1-2 days. No walk-ins accepted.     [facility] [phone]   Location: [facility], [address], [place] Clinic Hours: M, W, Th, F 8am or 1:30pm, Tues 9a or 1:30 - first come/first served. Services: Simple extractions, temporary fillings, uncomplicated abscess drainage.  You do not need to be an [facility] resident. Payment Options: PAYMENT IS DUE AT THE TIME OF SERVICE. Dental insurance, otherwise sliding scale - bring proof of income or support. Depending on income and treatment needed, cost is usually $50-200. Best way to get seen: Arrive early as it is first come/first served.     [facility] [phone]   Location: [address] Clinic Hours: Mon-Thu 8a-5p Services: Most basic dental services including extractions and fillings. Payment Options: PAYMENT IS DUE AT THE TIME OF SERVICE. Sliding scale, up to 50% off - bring proof if income or support. Medicaid with dental option accepted. Best way to get seen: Call to schedule an appointment, can usually be seen within 2 weeks OR they will try to see walk-ins - show up at 8a or 2p (you may have to wait).     [facility] [phone] [place] RESIDENTS ONLY   Location: [facility], [address], [place], [place] [address] Clinic Hours: By appointment only. Monday - Thursday 8am-5pm, Friday 8am-12pm Services: Cleanings, fillings, extractions. Payment Options: PAYMENT IS DUE AT THE TIME OF SERVICE. Cash, Visa or MasterCard. Sliding scale - $30 minimum per service. Best way to get seen: Come in to office, complete packet and make an appointment -  need proof of income or support monies for each household member and proof of [facility]  residence. Usually takes about a month to get in.     [facility] [phone]   Location: [address], [facility], [place] Clinic Hours: Walk-in Urgent Care Dental Services are offered Monday-Friday mornings only. The numbers of emergencies accepted daily is limited to the number of providers available. Maximum 15 - Mondays, Wednesdays & Thursdays Maximum 10 - Tuesdays & Fridays Services: You do not need to be a [facility] resident to be seen for a dental emergency. Emergencies are defined as pain, swelling, abnormal bleeding, or dental trauma. Walkins will receive x-rays if needed. NOTE: Dental cleaning is not an emergency. Payment Options: PAYMENT IS DUE AT THE TIME OF SERVICE. Minimum co-pay is $40.00 for uninsured patients. Minimum co-pay is $3.00 for Medicaid with dental coverage. Dental Insurance is accepted and must be presented at time of visit. Medicare does not cover dental. Forms of payment: Cash, credit card, checks. Best way to get seen: If not previously registered with the clinic, walk-in dental registration begins at 7:15 am and is on a first come/first serve basis. If previously registered with the clinic, call to make an appointment.     [facility] [phone] [place] RESIDENTS ONLY   Location: [address], [place], [place] Clinic Hours: Mon-Thu 10a-2p Services: Extractions only! Payment Options: FREE (donations accepted) - bring proof of income or support Best way to get seen: Call and schedule an appointment OR come at 8am on the 1st Monday of every month (except for holidays) when it is first come/first served.     [facility] [phone]   Location: [address] [place], [place] Clinic Hours: Friday mornings Services, Payment Options, Best way to get seen: Call for info

## 2022-04-08 NOTE — ED Triage Notes (Signed)
Pt reports toothache to left upper and lower jaw. Pt states has had chipped teeth for awhile now but they really started hurting and swelling the last few days. Pt has been taking ibuprofen.

## 2022-04-08 NOTE — ED Notes (Signed)
See triage note Presents with dental pain  States she has broken teeth to left upper and lower gumline  Min swelling ntoed

## 2022-04-08 NOTE — ED Provider Notes (Signed)
[facility] Provider Note    Event Date/Time   First MD Initiated Contact with Patient 04/08/22 1057     (approximate)   History   Dental Pain   HPI  Nicole Leonard is a 26 y.o. female   presents to the ED with complaint of both upper and lower posterior dental pain.  Patient states that he began after she chipped her tooth but is gotten worse in the last several days.  She denies any fever or chills.  Currently she does not have a dentist.  Patient has a history of hypertension and continues to smoke cigarettes.      Physical Exam   Triage Vital Signs: ED Triage Vitals  Enc Vitals Group     BP 04/08/22 1037 (!) 141/96     Pulse Rate 04/08/22 1037 84     Resp 04/08/22 1037 16     Temp 04/08/22 1037 98.3 F (36.8 C)     Temp Source 04/08/22 1037 Oral     SpO2 04/08/22 1037 97 %     Weight 04/08/22 1035 (!) 315 lb 4.1 oz (143 kg)     Height 04/08/22 1035 5\' 4"  (1.626 m)     Head Circumference --      Peak Flow --      Pain Score 04/08/22 1035 10     Pain Loc --      Pain Edu? --      Excl. in GC? --     Most recent vital signs: Vitals:   04/08/22 1037  BP: (!) 141/96  Pulse: 84  Resp: 16  Temp: 98.3 F (36.8 C)  SpO2: 97%     General: Awake, no distress.  CV:  Good peripheral perfusion.  Resp:  Normal effort.  Abd:  No distention.  Other:     ED Results / Procedures / Treatments   Labs (all labs ordered are listed, but only abnormal results are displayed) Labs Reviewed - No data to display    PROCEDURES:  Critical Care performed:   Procedures   MEDICATIONS ORDERED IN ED: Medications  ibuprofen (ADVIL) tablet 600 mg (600 mg Oral Given 04/08/22 1121)     IMPRESSION / MDM / ASSESSMENT AND PLAN / ED COURSE  I reviewed the triage vital signs and the nursing notes.   Differential diagnosis includes, but is not limited to, dental caries, dental abscess, gingivitis.  26 year old female presents to the ED with  complaint of dental pain and facial swelling that has worsened in the last couple days.  Patient denies any fever or chills.  She states she does not have a dentist currently.  We discussed options and a list of dental clinics was given to her.  She was also given information about the walk-in dental clinics.  A prescription for clindamycin and ibuprofen was sent to the pharmacy.  Strongly encouraged to follow-up with a dentist.      Patient's presentation is most consistent with acute, uncomplicated illness.  FINAL CLINICAL IMPRESSION(S) / ED DIAGNOSES   Final diagnoses:  Dental abscess     Rx / DC Orders   ED Discharge Orders          Ordered    clindamycin (CLEOCIN) 300 MG capsule  3 times daily        04/08/22 1117    ibuprofen (ADVIL) 800 MG tablet  Every 8 hours PRN        04/08/22 1117  Note:  This document was prepared using Dragon voice recognition software and may include unintentional dictation errors.   Tommi Rumps, PA-C 04/08/22 1125    Arnaldo Natal, MD 04/08/22 1620

## 2022-04-20 ENCOUNTER — Telehealth: Payer: Self-pay | Admitting: Obstetrics and Gynecology

## 2022-04-20 NOTE — Telephone Encounter (Signed)
Patient called and states she has been having some burning in her vaginal area. Informed patients of openings on the nurse schedule today. Pt states she could not come in today due to work. Informed patient on next available day of Sept 8th. Patient states she would call back if she could not be seen at urgent care.

## 2022-04-27 ENCOUNTER — Ambulatory Visit: Payer: BC Managed Care – PPO

## 2022-05-02 ENCOUNTER — Encounter: Payer: Self-pay | Admitting: Emergency Medicine

## 2022-05-02 DIAGNOSIS — N9489 Other specified conditions associated with female genital organs and menstrual cycle: Secondary | ICD-10-CM | POA: Diagnosis not present

## 2022-05-02 DIAGNOSIS — R102 Pelvic and perineal pain: Secondary | ICD-10-CM | POA: Diagnosis not present

## 2022-05-02 DIAGNOSIS — N939 Abnormal uterine and vaginal bleeding, unspecified: Secondary | ICD-10-CM | POA: Insufficient documentation

## 2022-05-02 DIAGNOSIS — I10 Essential (primary) hypertension: Secondary | ICD-10-CM | POA: Insufficient documentation

## 2022-05-02 DIAGNOSIS — R197 Diarrhea, unspecified: Secondary | ICD-10-CM | POA: Diagnosis not present

## 2022-05-02 DIAGNOSIS — Z79899 Other long term (current) drug therapy: Secondary | ICD-10-CM | POA: Diagnosis not present

## 2022-05-02 LAB — CBC
HCT: 41.2 % (ref 36.0–46.0)
Hemoglobin: 13.4 g/dL (ref 12.0–15.0)
MCH: 29.2 pg (ref 26.0–34.0)
MCHC: 32.5 g/dL (ref 30.0–36.0)
MCV: 89.8 fL (ref 80.0–100.0)
Platelets: 425 10*3/uL — ABNORMAL HIGH (ref 150–400)
RBC: 4.59 MIL/uL (ref 3.87–5.11)
RDW: 13.9 % (ref 11.5–15.5)
WBC: 8.9 10*3/uL (ref 4.0–10.5)
nRBC: 0 % (ref 0.0–0.2)

## 2022-05-02 LAB — URINALYSIS, ROUTINE W REFLEX MICROSCOPIC
Bacteria, UA: NONE SEEN
Bilirubin Urine: NEGATIVE
Glucose, UA: NEGATIVE mg/dL
Ketones, ur: 5 mg/dL — AB
Leukocytes,Ua: NEGATIVE
Nitrite: NEGATIVE
Protein, ur: 100 mg/dL — AB
RBC / HPF: >50 RBC/hpf — ABNORMAL HIGH (ref 0–5)
Specific Gravity, Urine: 1.029 (ref 1.005–1.030)
pH: 6 (ref 5.0–8.0)

## 2022-05-02 LAB — COMPREHENSIVE METABOLIC PANEL
ALT: 33 U/L (ref 0–44)
AST: 20 U/L (ref 15–41)
Albumin: 4.3 g/dL (ref 3.5–5.0)
Alkaline Phosphatase: 68 U/L (ref 38–126)
Anion gap: 9 (ref 5–15)
BUN: 9 mg/dL (ref 6–20)
CO2: 22 mmol/L (ref 22–32)
Calcium: 9.4 mg/dL (ref 8.9–10.3)
Chloride: 110 mmol/L (ref 98–111)
Creatinine, Ser: 0.84 mg/dL (ref 0.44–1.00)
GFR, Estimated: >60 mL/min (ref >60)
Glucose, Bld: 91 mg/dL (ref 70–99)
Potassium: 3.5 mmol/L (ref 3.5–5.1)
Sodium: 141 mmol/L (ref 135–145)
Total Bilirubin: 1.6 mg/dL — ABNORMAL HIGH (ref 0.3–1.2)
Total Protein: 8 g/dL (ref 6.5–8.1)

## 2022-05-02 LAB — LIPASE, BLOOD: Lipase: 25 U/L (ref 11–51)

## 2022-05-02 LAB — HCG, QUANTITATIVE, PREGNANCY: hCG, Beta Chain, Quant, S: <1 m[IU]/mL (ref <5)

## 2022-05-02 NOTE — ED Triage Notes (Signed)
Pt reports she has had intermittent diarrhea x1 month since taken an antibiotic and in last week has noticed blood when wiping after BM. Pt also concerned due to intermittent vaginal spotting since having IUD placed in August.

## 2022-05-03 ENCOUNTER — Emergency Department: Payer: BC Managed Care – PPO

## 2022-05-03 ENCOUNTER — Emergency Department
Admission: EM | Admit: 2022-05-03 | Discharge: 2022-05-03 | Disposition: A | Discharge status: Home / Self Care | Payer: BC Managed Care – PPO | Attending: Emergency Medicine | Admitting: Emergency Medicine

## 2022-05-03 DIAGNOSIS — R197 Diarrhea, unspecified: Secondary | ICD-10-CM

## 2022-05-03 DIAGNOSIS — N939 Abnormal uterine and vaginal bleeding, unspecified: Secondary | ICD-10-CM

## 2022-05-03 LAB — C DIFFICILE QUICK SCREEN W PCR REFLEX
C Diff antigen: NEGATIVE
C Diff interpretation: NOT DETECTED
C Diff toxin: NEGATIVE

## 2022-05-03 NOTE — ED Provider Notes (Signed)
[facility] Provider Note    Event Date/Time   First MD Initiated Contact with Patient 05/03/22 0013     (approximate)   History   Vaginal Bleeding and Rectal Bleeding   HPI  Nicole Leonard is a 26 y.o. female who presents to the ED from home with a chief complaint of intermittent diarrhea x1 month and vaginal spotting since having Nexplanon placed in August.  Patient reports she was on an antibiotic when she began to have intermittent diarrhea 1 month ago.  Has noticed blood-tinged toilet paper after wiping after BM.  Also concern for intermittent vaginal spotting since having Dexomon placed in August.  Denies fever, cough, chest pain, shortness of breath, abdominal/pelvic pain, nausea, vomiting or dysuria.     Past Medical History   Past Medical History:  Diagnosis Date   Hypertension    Patient denies medical problems    PID (pelvic inflammatory disease)    2018     Active Problem List   Patient Active Problem List   Diagnosis Date Noted   IUFD at 6 weeks or more of gestation 02/01/2022   Pregnancy 01/30/2022   Depression affecting pregnancy in third trimester, antepartum 01/17/2022   Labor and delivery, indication for care 01/14/2022   Leg swelling in pregnancy in second trimester 12/18/2021     Past Surgical History   Past Surgical History:  Procedure Laterality Date   denies       Home Medications   Prior to Admission medications   Medication Sig Start Date End Date Taking? Authorizing Provider  amLODipine (NORVASC) 5 MG tablet Take 1 tablet (5 mg total) by mouth daily. 02/16/22   Rubie Maid, MD  ibuprofen (ADVIL) 800 MG tablet Take 1 tablet (800 mg total) by mouth every 8 (eight) hours as needed. 04/08/22   Johnn Hai, PA-C     Allergies  Lavender oil and Penicillins   Family History   Family History  Problem Relation Age of Onset   Diabetes Maternal Grandmother    Diabetes Father    Diabetes Mother       Physical Exam  Triage Vital Signs: ED Triage Vitals [05/02/22 2039]  Enc Vitals Group     BP 128/76     Pulse Rate 89     Resp 16     Temp 98.8 F (37.1 C)     Temp Source Oral     SpO2 95 %     Weight (!) 303 lb (137.4 kg)     Height 5' 4"  (1.626 m)     Head Circumference      Peak Flow      Pain Score      Pain Loc      Pain Edu?      Excl. in [place]?     Updated Vital Signs: BP 125/80   Pulse 74   Temp 98.8 F (37.1 C) (Oral)   Resp 18   Ht 5' 4"  (1.626 m)   Wt (!) 137.4 kg   LMP 02/16/2022 (Exact Date) Comment: lighter than normal  SpO2 98%   BMI 52.01 kg/m    General: Awake, no distress.  CV:  RRR.  Good peripheral perfusion.  Resp:  Normal effort.  CTA B. Abd:  Obese.  Nontender to light or deep palpation.  No distention.  Other:  No CVAT.   ED Results / Procedures / Treatments  Labs (all labs ordered are listed, but only abnormal results are  displayed) Labs Reviewed  COMPREHENSIVE METABOLIC PANEL - Abnormal; Notable for the following components:      Result Value   Total Bilirubin 1.6 (*)    All other components within normal limits  CBC - Abnormal; Notable for the following components:   Platelets 425 (*)    All other components within normal limits  URINALYSIS, ROUTINE W REFLEX MICROSCOPIC - Abnormal; Notable for the following components:   Color, Urine AMBER (*)    APPearance HAZY (*)    Hgb urine dipstick LARGE (*)    Ketones, ur 5 (*)    Protein, ur 100 (*)    RBC / HPF >50 (*)    All other components within normal limits  C DIFFICILE QUICK SCREEN W PCR REFLEX    LIPASE, BLOOD  HCG, QUANTITATIVE, PREGNANCY     EKG  None   RADIOLOGY I have independently visualized and interpreted patient's ultrasound as well as noted the radiology interpretation:  Pelvic ultrasound: Unremarkable  Official radiology report(s): US PELVIC COMPLETE W TRANSVAGINAL AND TORSION R/O  Result Date: 05/03/2022 CLINICAL DATA:  Vaginal spotting EXAM:  TRANSABDOMINAL AND TRANSVAGINAL ULTRASOUND OF PELVIS DOPPLER ULTRASOUND OF OVARIES TECHNIQUE: Both transabdominal and transvaginal ultrasound examinations of the pelvis were performed. Transabdominal technique was performed for global imaging of the pelvis including uterus, ovaries, adnexal regions, and pelvic cul-de-sac. It was necessary to proceed with endovaginal exam following the transabdominal exam to visualize the ovaries. Color and duplex Doppler ultrasound was utilized to evaluate blood flow to the ovaries. COMPARISON:  None Available. FINDINGS: Uterus Measurements: 8.7 x 4.2 x 5.9 cm = volume: 113 mL. No fibroids or other mass visualized. Endometrium Thickness: 6 mm in thickness.  No focal abnormality visualized. Right ovary Measurements: 4.1 x 1.7 x 1.7 cm = volume: 6.3 mL. Normal appearance/no adnexal mass. Left ovary Measurements: 3.7 x 2.0 x 1.5 cm = volume: 5.5 mL. Normal appearance/no adnexal mass. Pulsed Doppler evaluation of both ovaries demonstrates normal low-resistance arterial and venous waveforms. Other findings No abnormal free fluid. IMPRESSION: No acute findings or significant abnormality. Electronically Signed   By: Rolm Baptise M.D.   On: 05/03/2022 01:32     PROCEDURES:  Critical Care performed: No  Procedures   MEDICATIONS ORDERED IN ED: Medications - No data to display   IMPRESSION / MDM / [place] / ED COURSE  I reviewed the triage vital signs and the nursing notes.                             26 year old female presenting with intermittent diarrhea x 1 month while on an antibiotic and vaginal spotting with recent Nexplanon implantation.  I have personally reviewed patient's records and note her last GYN office visit on 04/06/2022.  Patient's presentation is most consistent with acute presentation with potential threat to life or bodily function.  Laboratory results demonstrate normal H/H 13/42, normal electrolytes, greater than 50 RBC in urine.  Will  obtain pelvic ultrasound, stool studies if patient is able to produce.  Patient declines analgesic at this time.  Will reassess.  Clinical Course as of 05/03/22 0502  Thu May 03, 2022  0151 Updated patient and family member on unremarkable pelvic ultrasound.  Patient was not able to provide enough stool specimen.  Did offer to send patient home with stool collection kit but she declines.  She may have enough to run a C. difficile at least.  She will follow-up  closely with her OB/GYN.  Strict return precautions given.  Both verbalized understanding agree with plan of care. [JS]  0501 Addendum on chart review: C. difficile was negative [JS]    Clinical Course User Index [JS] Paulette Blanch, MD     FINAL CLINICAL IMPRESSION(S) / ED DIAGNOSES   Final diagnoses:  Vaginal bleeding  Diarrhea, unspecified type  Abnormal vaginal bleeding     Rx / DC Orders   ED Discharge Orders     None        Note:  This document was prepared using Dragon voice recognition software and may include unintentional dictation errors.   Paulette Blanch, MD 05/03/22 [phone]

## 2022-05-03 NOTE — ED Notes (Signed)
Signing pad did not work. Pt verbalized understanding of DC instructions. 

## 2022-05-03 NOTE — ED Notes (Signed)
ED Provider at bedside. 

## 2022-05-03 NOTE — ED Notes (Signed)
Pt brought to ED rm 18 at this time, this RN now assuming care. 

## 2022-05-03 NOTE — Discharge Instructions (Signed)
Return to the ER for worsening symptoms, persistent vomiting, difficulty breathing or other concerns. °

## 2022-05-03 NOTE — ED Notes (Signed)
Pateint called for c diff results.  informed her negative results.

## 2022-05-10 ENCOUNTER — Ambulatory Visit (LOCAL_COMMUNITY_HEALTH_CENTER): Payer: BC Managed Care – PPO | Admitting: Advanced Practice Midwife

## 2022-05-10 ENCOUNTER — Telehealth: Payer: Self-pay

## 2022-05-10 ENCOUNTER — Encounter: Payer: Self-pay | Admitting: Advanced Practice Midwife

## 2022-05-10 VITALS — BP 148/92 | Ht 64.0 in | Wt 301.8 lb

## 2022-05-10 DIAGNOSIS — F17200 Nicotine dependence, unspecified, uncomplicated: Secondary | ICD-10-CM | POA: Diagnosis not present

## 2022-05-10 DIAGNOSIS — I10 Essential (primary) hypertension: Secondary | ICD-10-CM

## 2022-05-10 DIAGNOSIS — F1290 Cannabis use, unspecified, uncomplicated: Secondary | ICD-10-CM

## 2022-05-10 DIAGNOSIS — Z3009 Encounter for other general counseling and advice on contraception: Secondary | ICD-10-CM

## 2022-05-10 DIAGNOSIS — Z113 Encounter for screening for infections with a predominantly sexual mode of transmission: Secondary | ICD-10-CM

## 2022-05-10 LAB — WET PREP FOR TRICH, YEAST, CLUE
Trichomonas Exam: NEGATIVE
Yeast Exam: NEGATIVE

## 2022-05-10 LAB — HM HEPATITIS C SCREENING LAB: HM Hepatitis Screen: NEGATIVE

## 2022-05-10 LAB — HM HIV SCREENING LAB: HM HIV Screening: NEGATIVE

## 2022-05-10 NOTE — Telephone Encounter (Signed)
Patient called with concerns of having elevated blood pressure of 149/96 with moderate to severe chest pain and sob after smoking. She was evaluated at the [facility] and was advised to have her medication adjusted.  She has not taken her blood pressure medication today. I advised patient to go to [facility] for evaluation. She denies dizziness, palpitation and visual disturbances. She did not want to go to [facility] or ED for evaluation. Explained to her signs and symptoms of stroke and if her chest pain and sob got any worse then she should call 911. Also advised patient to take her medication and after one hour check her blood pressure again and call me back and let me know what it was.  She also has concerns about spotting with Nexplanon. She had Nexplanon inserted on 02/16/2022. I informed her that she could have irregular or prolonged bleeding up to 4-6 weeks after insertion. Also that irregular bleeding is a normal side effect of the Nexplanon and is not cause for concern, but if bothersome she could discuss options with provider. She verbalized understanding.

## 2022-05-10 NOTE — Progress Notes (Signed)
Pt is here for PE and STD screening.  Wet mount results reviewed, no treatment required.  FP packet given.  Marrion Finan M Marissia Blackham, RN  

## 2022-05-10 NOTE — Progress Notes (Signed)
[place] problem visit  [place] Department  Subjective:  Lasheka Kempner is a 26 y.o. engaged BF smoker G1P0 being seen today for physical and STD screen.  Chief Complaint  Patient presents with   Annual Exam    [place] discharge per patient, here to get STD tested and for PE    HPI Here for physical and STD screen. Smoking 1/2-3/4 ppd. Last vaped age 50. Last MJ yesterday. Last ETOH 02/20/22 (5 shots moonshine) 3x/year. Last dental exam 03/2022. LMP none since IUFD at 38 wks with SVD M on 02/02/22. Declines counseling. Employed 48 hrs/wk. During pregnancy had severe range BP, chronic HTN on HCTZ and Labelalol, GHTN. Last sex 04/10/22 without condom; with current partner x 6 years; 1 sex partner in last 3 mo. Dr. Marcelline Mates at [facility] is managing her HTN and she last saw her 1.5 mo ago and took  Norvasc 5 mg yesterday.Last PE at pp 02/16/22. Last pap 10/03/21 neg.   Does the patient have a current or past history of drug use? Yes   No components found for: "HCV"]   Health Maintenance Due  Topic Date Due   COVID-19 Vaccine (1) Never done   HPV VACCINES (2 - 2-dose series) 01/04/2009   INFLUENZA VACCINE  Never done    Review of Systems  Cardiovascular:  Positive for chest pain (prn when smokes x 30 min; referred to primary care MD).  Genitourinary:  Positive for frequency (resolved now).  Neurological:  Positive for headaches (daily always above right eye which resolves spontaneously).  All other systems reviewed and are negative.   The following portions of the patient's history were reviewed and updated as appropriate: allergies, current medications, past family history, past medical history, past social history, past surgical history and problem list. Problem list updated.   See flowsheet for other program required questions.  Objective:   Vitals:   05/10/22 0836  BP: (!) 148/92  Weight: (!) 301 lb 12.8 oz (136.9 kg)  Height: 5\' 4"  (1.626 m)     Physical Exam Vitals and nursing note reviewed.  Constitutional:      Appearance: Normal appearance. She is obese.  HENT:     Head: Normocephalic.  Eyes:     Conjunctiva/sclera: Conjunctivae normal.  Pulmonary:     Effort: Pulmonary effort is normal.  Abdominal:     Palpations: Abdomen is soft.     Comments: Soft without masses or tenderness, poor tone,, increased adipose  Genitourinary:    General: Normal vulva.     Exam position: Lithotomy position.     Vagina: Vaginal discharge (light brown discharge; ph<4.5) present.     Cervix: Normal.     Rectum: Normal.  Musculoskeletal:        General: Normal range of motion.  Lymphadenopathy:     Cervical:     Right cervical: No superficial, deep or posterior cervical adenopathy.    Left cervical: No superficial, deep or posterior cervical adenopathy.  Skin:    General: Skin is warm and dry.  Neurological:     Mental Status: She is alert. Mental status is at baseline.      Assessment and Plan:  Eula Jaster is a 26 y.o. female presenting to the [facility] Department for a Women's Health problem visit  1. Hypertension, unspecified type States Dr. Marcelline Mates at [facility] is giving her Amlodipine 5 mg and she last took it yesterday and saw Dr. Marcelline Mates 1 1/2 mo ago  Pt encouraged to make apt with Dr. Valentino Saxon to adjust med for HTN as is 148/92 today  2. Morbid obesity (HCC) 301 lbs   3. Screening examination for venereal disease Treat wet mount per standing orders Immunization nurse consult Pt refusing contact card for counseling with Kathreen Cosier, LCSW  - WET PREP FOR TRICH, YEAST, CLUE - Chlamydia/Gonorrhea [place] - Gonococcus culture - Syphilis Serology, [place] [facility] - HIV/HCV [place] [facility]     No follow-ups on file.  No future appointments.  Alberteen Spindle, CNM

## 2022-05-11 NOTE — Telephone Encounter (Signed)
Ok yes.  Also, patient should be encouraged to not smoke.  Her blood pressures overall are still better than what they have been in the recent past. She should be taking her BP meds daily, and if she continues to experience issues, she needs to be evaluated in urgent care or by a PCP.   Nexplanon breakthrough bleeding can occur even up to 3 months and additionally as you noted, can be an issues even after this time.

## 2022-05-11 NOTE — Telephone Encounter (Signed)
Called patient . She verbalized understanding.

## 2022-05-14 LAB — GONOCOCCUS CULTURE

## 2022-05-19 IMAGING — CR DG CHEST 2V
2 series · 2 of 2 positions shown · non-contrast
Comparison: None.

CLINICAL DATA: Midsternal chest pain x1 week

EXAM:
CHEST - 2 VIEW

[chest pa]
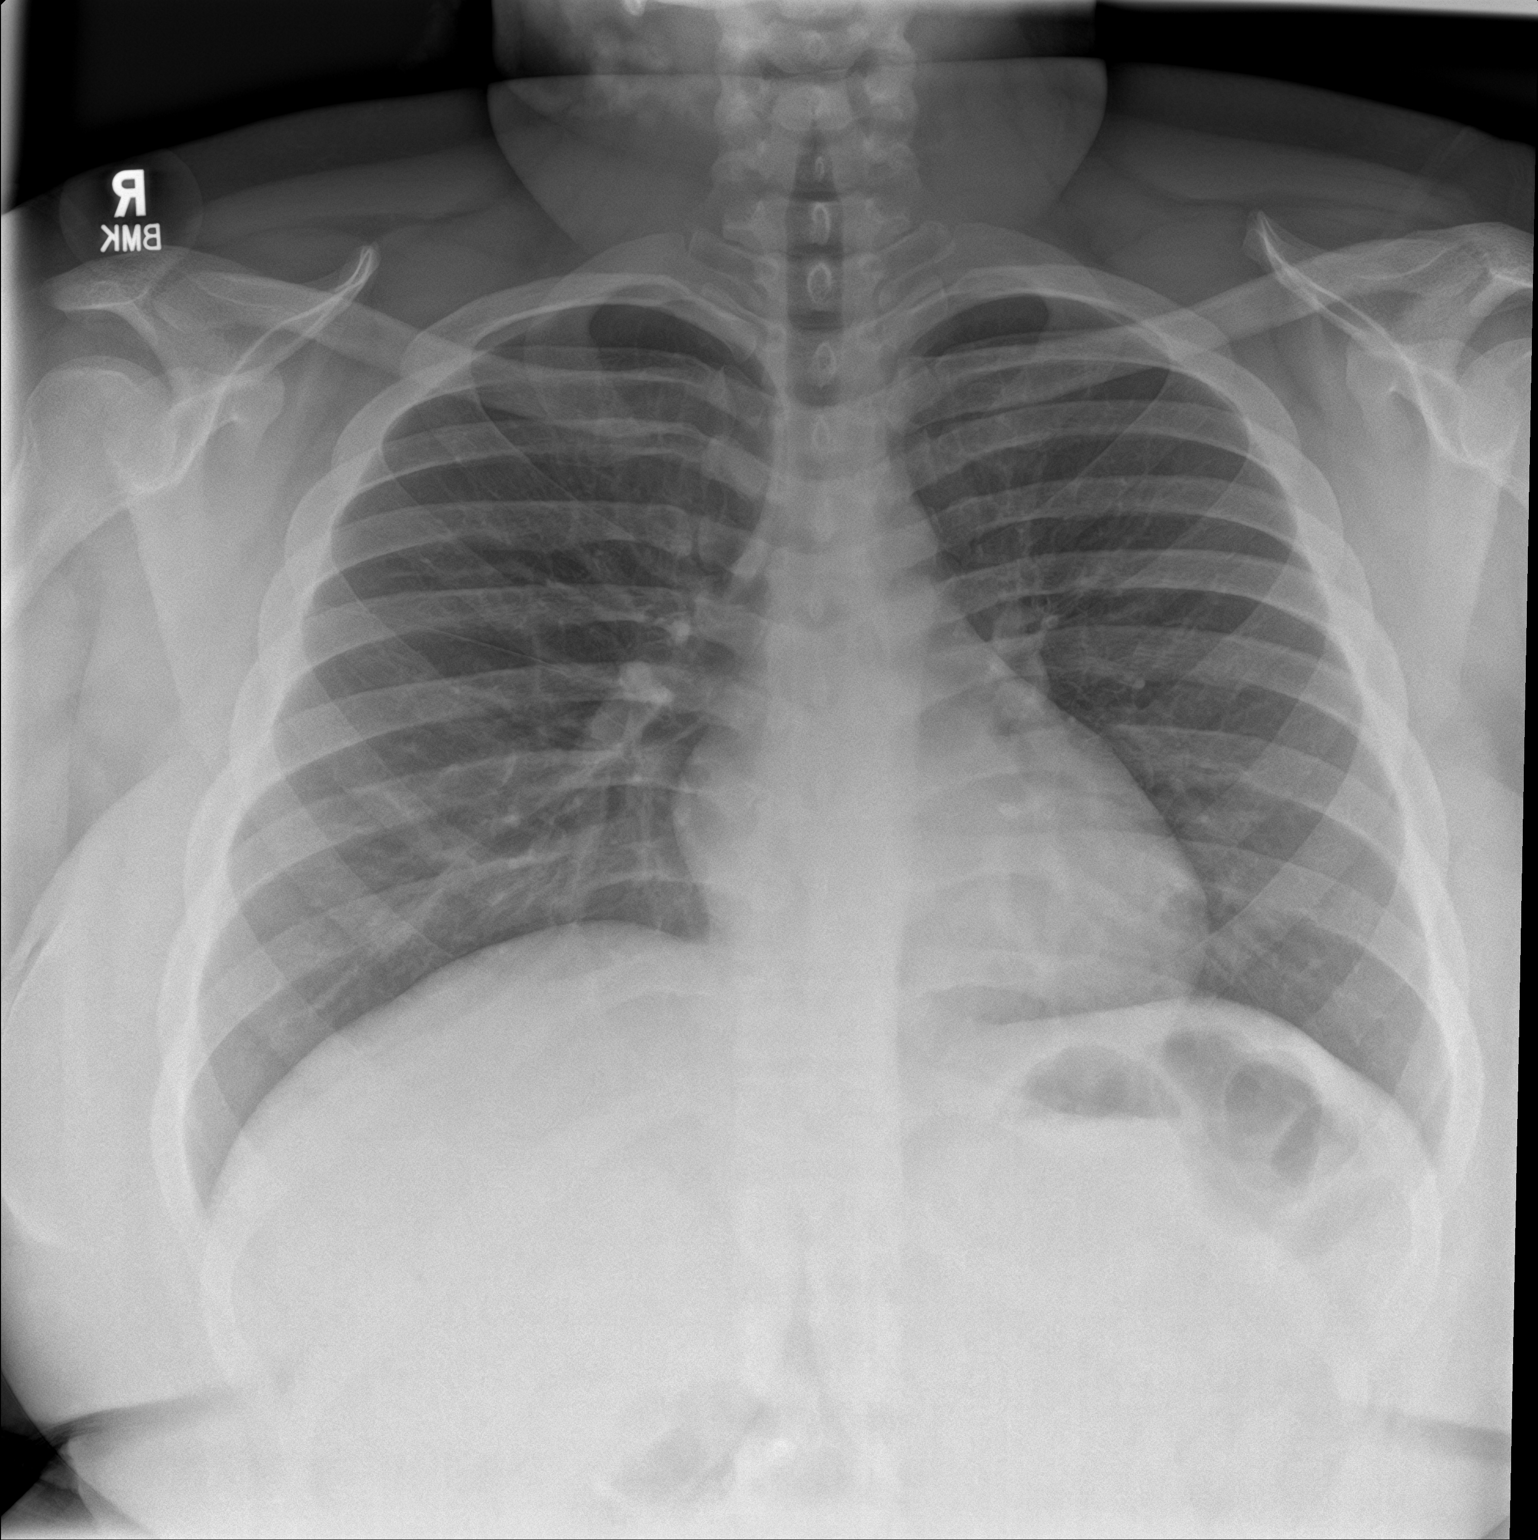

[chest lat]
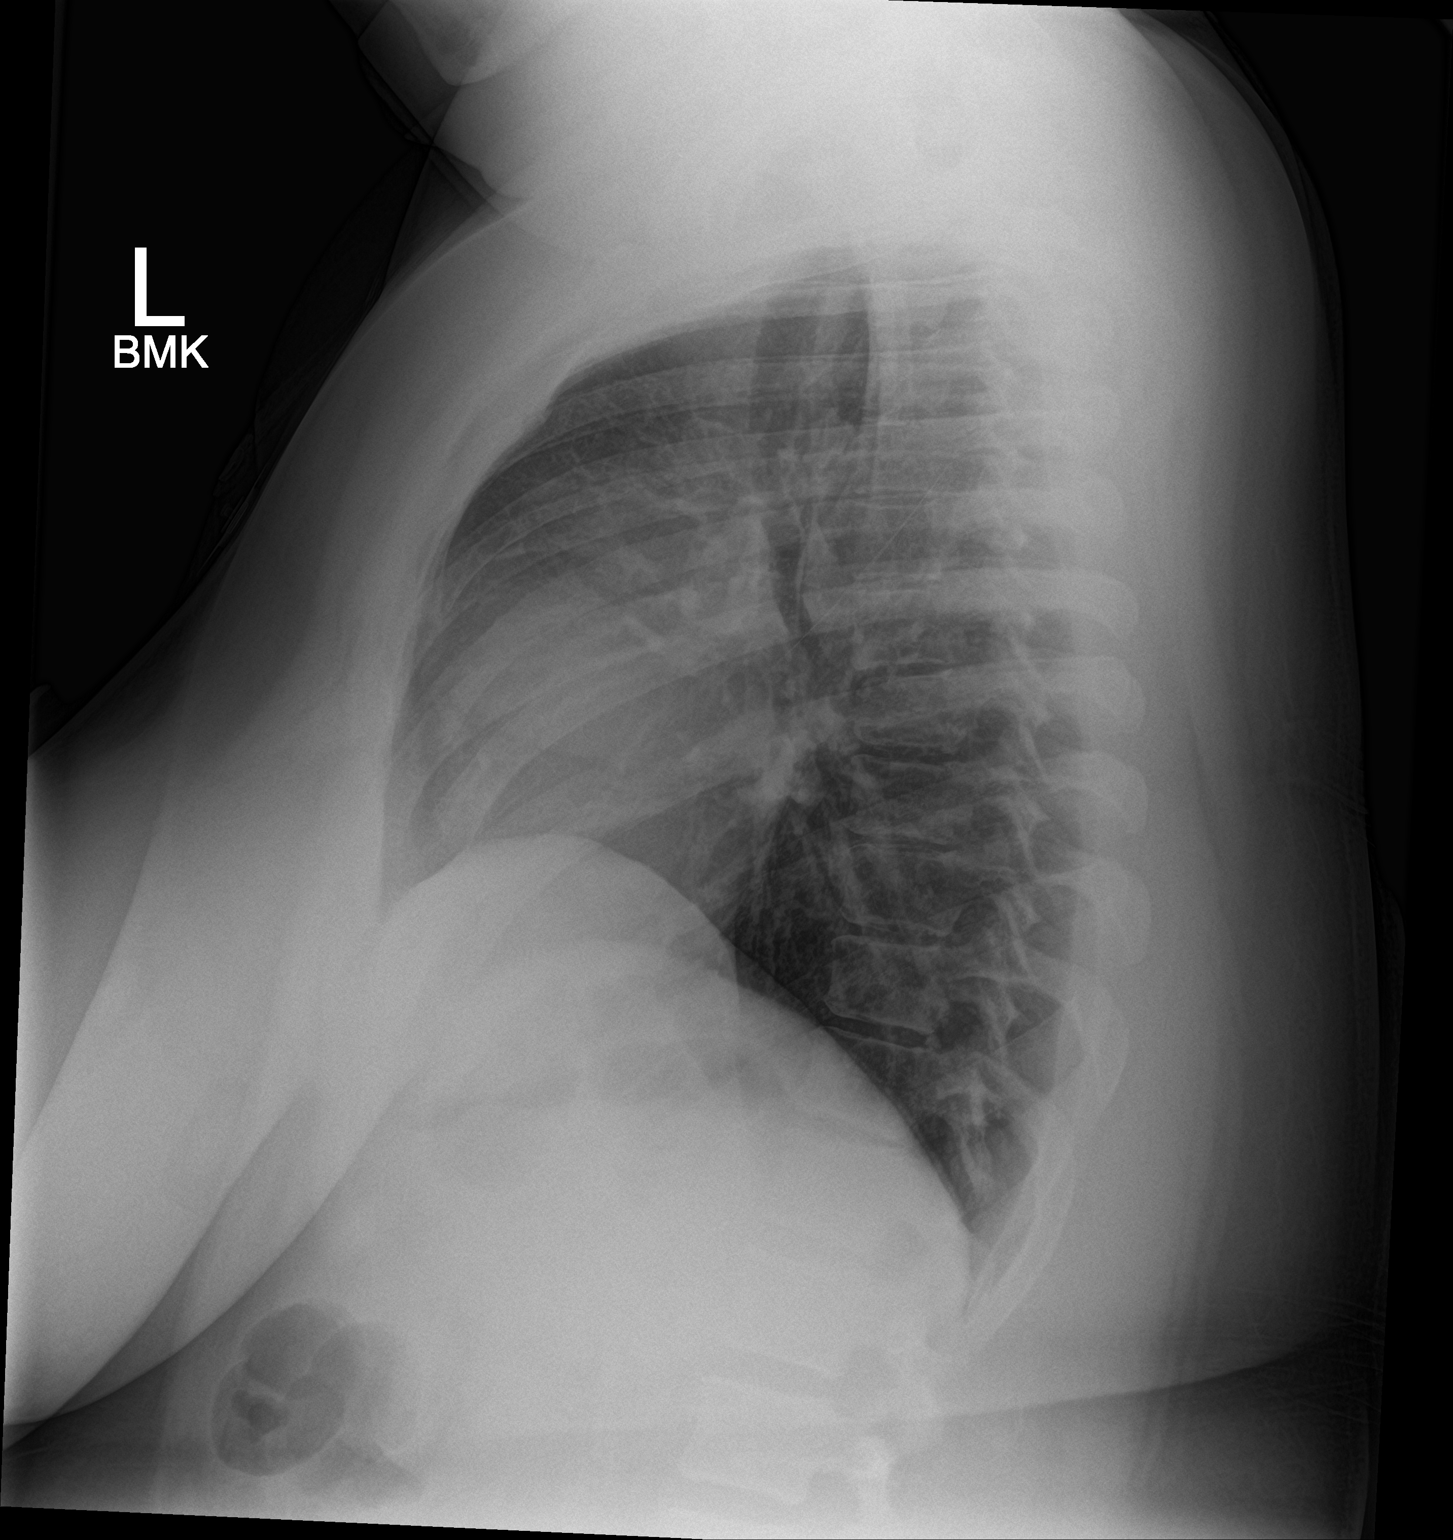

[2 of 2 positions shown; findings below may reference images not displayed]

FINDINGS: The heart size and mediastinal contours are within normal limits.
Both lungs are clear. The visualized skeletal structures are
unremarkable.
IMPRESSION: No active cardiopulmonary disease.

## 2023-01-16 ENCOUNTER — Other Ambulatory Visit: Payer: Self-pay

## 2023-01-29 ENCOUNTER — Telehealth: Payer: Self-pay | Admitting: Obstetrics and Gynecology

## 2023-01-29 NOTE — Telephone Encounter (Signed)
The patient is calling to follow up on prescription refill for amLODipine (NORVASC) 5 MG tablet. The patient is stating she has 2 weeks Left and is needing refill. No openings for annuals at this time. Please advise?

## 2023-01-29 NOTE — Telephone Encounter (Signed)
Patient needs to be scheduled for an annual in the next 3 months, and can get refill until her appointment.

## 2023-01-30 MED ORDER — AMLODIPINE BESYLATE 5 MG PO TABS: 5.0000 mg | ORAL_TABLET | Freq: Every day | ORAL | 0 refills | Status: DC
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
## 2023-03-12 ENCOUNTER — Encounter: Payer: Self-pay | Admitting: Obstetrics and Gynecology

## 2023-03-12 ENCOUNTER — Ambulatory Visit (INDEPENDENT_AMBULATORY_CARE_PROVIDER_SITE_OTHER): Payer: BC Managed Care – PPO | Admitting: Obstetrics and Gynecology

## 2023-03-12 VITALS — BP 109/76 | HR 87 | Resp 16 | Ht 64.0 in | Wt 301.9 lb

## 2023-03-12 DIAGNOSIS — Z131 Encounter for screening for diabetes mellitus: Secondary | ICD-10-CM | POA: Diagnosis not present

## 2023-03-12 DIAGNOSIS — Z01419 Encounter for gynecological examination (general) (routine) without abnormal findings: Secondary | ICD-10-CM | POA: Diagnosis not present

## 2023-03-12 DIAGNOSIS — I10 Essential (primary) hypertension: Secondary | ICD-10-CM

## 2023-03-12 DIAGNOSIS — Z13220 Encounter for screening for lipoid disorders: Secondary | ICD-10-CM

## 2023-03-12 DIAGNOSIS — R0789 Other chest pain: Secondary | ICD-10-CM

## 2023-03-12 MED ORDER — AMLODIPINE BESYLATE 5 MG PO TABS: 5.0000 mg | ORAL_TABLET | Freq: Every day | ORAL | 3 refills | Status: DC

## 2023-03-12 NOTE — Patient Instructions (Signed)
Preventive Care 21-27 Years Old, Female Preventive care refers to lifestyle choices and visits with your health care provider that can promote health and wellness. Preventive care visits are also called wellness exams. What can I expect for my preventive care visit? Counseling During your preventive care visit, your health care provider may ask about your: Medical history, including: Past medical problems. Family medical history. Pregnancy history. Current health, including: Menstrual cycle. Method of birth control. Emotional well-being. Home life and relationship well-being. Sexual activity and sexual health. Lifestyle, including: Alcohol, nicotine or tobacco, and drug use. Access to firearms. Diet, exercise, and sleep habits. Work and work environment. Sunscreen use. Safety issues such as seatbelt and bike helmet use. Physical exam Your health care provider may check your: Height and weight. These may be used to calculate your BMI (body mass index). BMI is a measurement that tells if you are at a healthy weight. Waist circumference. This measures the distance around your waistline. This measurement also tells if you are at a healthy weight and may help predict your risk of certain diseases, such as type 2 diabetes and high blood pressure. Heart rate and blood pressure. Body temperature. Skin for abnormal spots. What immunizations do I need?  Vaccines are usually given at various ages, according to a schedule. Your health care provider will recommend vaccines for you based on your age, medical history, and lifestyle or other factors, such as travel or where you work. What tests do I need? Screening Your health care provider may recommend screening tests for certain conditions. This may include: Pelvic exam and Pap test. Lipid and cholesterol levels. Diabetes screening. This is done by checking your blood sugar (glucose) after you have not eaten for a while (fasting). Hepatitis  B test. Hepatitis C test. HIV (human immunodeficiency virus) test. STI (sexually transmitted infection) testing, if you are at risk. BRCA-related cancer screening. This may be done if you have a family history of breast, ovarian, tubal, or peritoneal cancers. Talk with your health care provider about your test results, treatment options, and if necessary, the need for more tests. Follow these instructions at home: Eating and drinking  Eat a healthy diet that includes fresh fruits and vegetables, whole grains, lean protein, and low-fat dairy products. Take vitamin and mineral supplements as recommended by your health care provider. Do not drink alcohol if: Your health care provider tells you not to drink. You are pregnant, may be pregnant, or are planning to become pregnant. If you drink alcohol: Limit how much you have to 0-1 drink a day. Know how much alcohol is in your drink. In the [place] one drink equals one 12 oz bottle of beer (355 mL), one 5 oz glass of wine (148 mL), or one 1 oz glass of hard liquor (44 mL). Lifestyle Brush your teeth every morning and night with fluoride toothpaste. Floss one time each day. Exercise for at least 30 minutes 5 or more days each week. Do not use any products that contain nicotine or tobacco. These products include cigarettes, chewing tobacco, and vaping devices, such as e-cigarettes. If you need help quitting, ask your health care provider. Do not use drugs. If you are sexually active, practice safe sex. Use a condom or other form of protection to prevent STIs. If you do not wish to become pregnant, use a form of birth control. If you plan to become pregnant, see your health care provider for a prepregnancy visit. Find healthy ways to manage stress, such as: Meditation,   yoga, or listening to music. Journaling. Talking to a trusted person. Spending time with friends and family. Minimize exposure to UV radiation to reduce your risk of skin  cancer. Safety Always wear your seat belt while driving or riding in a vehicle. Do not drive: If you have been drinking alcohol. Do not ride with someone who has been drinking. If you have been using any mind-altering substances or drugs. While texting. When you are tired or distracted. Wear a helmet and other protective equipment during sports activities. If you have firearms in your house, make sure you follow all gun safety procedures. Seek help if you have been physically or sexually abused. What's next? Go to your health care provider once a year for an annual wellness visit. Ask your health care provider how often you should have your eyes and teeth checked. Stay up to date on all vaccines. This information is not intended to replace advice given to you by your health care provider. Make sure you discuss any questions you have with your health care provider. Document Revised: 02/01/2021 Document Reviewed: 02/01/2021 Elsevier Patient Education  2024 Elsevier Inc. Breast Self-Awareness Breast self-awareness is knowing how your breasts look and feel. You need to: Check your breasts on a regular basis. Tell your doctor about any changes. Become familiar with the look and feel of your breasts. This can help you catch a breast problem while it is still small and can be treated. You should do breast self-exams even if you have breast implants. What you need: A mirror. A well-lit room. A pillow or other soft object. How to do a breast self-exam Follow these steps to do a breast self-exam: Look for changes  Take off all the clothes above your waist. Stand in front of a mirror in a room with good lighting. Put your hands down at your sides. Compare your breasts in the mirror. Look for any difference between them, such as: A difference in shape. A difference in size. Wrinkles, dips, and bumps in one breast and not the other. Look at each breast for changes in the skin, such  as: Redness. Scaly areas. Skin that has gotten thicker. Dimpling. Open sores (ulcers). Look for changes in your nipples, such as: Fluid coming out of a nipple. Fluid around a nipple. Bleeding. Dimpling. Redness. A nipple that looks pushed in (retracted), or that has changed position. Feel for changes Lie on your back. Feel each breast. To do this: Pick a breast to feel. Place a pillow under the shoulder closest to that breast. Put the arm closest to that breast behind your head. Feel the nipple area of that breast using the hand of your other arm. Feel the area with the pads of your three middle fingers by making small circles with your fingers. Use light, medium, and firm pressure. Continue the overlapping circles, moving downward over the breast. Keep making circles with your fingers. Stop when you feel your ribs. Start making circles with your fingers again, this time going upward until you reach your collarbone. Then, make circles outward across your breast and into your armpit area. Squeeze your nipple. Check for discharge and lumps. Repeat these steps to check your other breast. Sit or stand in the tub or shower. With soapy water on your skin, feel each breast the same way you did when you were lying down. Write down what you find Writing down what you find can help you remember what to tell your doctor. Write down: What is   normal for each breast. Any changes you find in each breast. These include: The kind of changes you find. A tender or painful breast. Any lump you find. Write down its size and where it is. When you last had your monthly period (menstrual cycle). General tips If you are breastfeeding, the best time to check your breasts is after you feed your baby or after you use a breast pump. If you get monthly bleeding, the best time to check your breasts is 5-7 days after your monthly cycle ends. With time, you will become comfortable with the self-exam. You will  also start to know if there are changes in your breasts. Contact a doctor if: You see a change in the shape or size of your breasts or nipples. You see a change in the skin of your breast or nipples, such as red or scaly skin. You have fluid coming from your nipples that is not normal. You find a new lump or thick area. You have breast pain. You have any concerns about your breast health. Summary Breast self-awareness includes looking for changes in your breasts and feeling for changes within your breasts. You should do breast self-awareness in front of a mirror in a well-lit room. If you get monthly periods (menstrual cycles), the best time to check your breasts is 5-7 days after your period ends. Tell your doctor about any changes you see in your breasts. Changes include changes in size, changes on the skin, painful or tender breasts, or fluid from your nipples that is not normal. This information is not intended to replace advice given to you by your health care provider. Make sure you discuss any questions you have with your health care provider. Document Revised: 01/11/2022 Document Reviewed: 06/08/2021 Elsevier Patient Education  2024 Elsevier Inc.  

## 2023-03-13 LAB — CBC
Hematocrit: 40.1 % (ref 34.0–46.6)
Hemoglobin: 13.6 g/dL (ref 11.1–15.9)
MCH: 30.8 pg (ref 26.6–33.0)
MCHC: 33.9 g/dL (ref 31.5–35.7)
MCV: 91 fL (ref 79–97)
Platelets: 391 10*3/uL (ref 150–450)
RBC: 4.42 x10E6/uL (ref 3.77–5.28)
RDW: 11.9 % (ref 11.7–15.4)
WBC: 11.9 10*3/uL — ABNORMAL HIGH (ref 3.4–10.8)

## 2023-03-13 LAB — LIPID PANEL
Chol/HDL Ratio: 4.5 ratio — ABNORMAL HIGH (ref 0.0–4.4)
Cholesterol, Total: 196 mg/dL (ref 100–199)
HDL: 44 mg/dL (ref >39)
LDL Chol Calc (NIH): 127 mg/dL — ABNORMAL HIGH (ref 0–99)
Triglycerides: 140 mg/dL (ref 0–149)
VLDL Cholesterol Cal: 25 mg/dL (ref 5–40)

## 2023-03-13 LAB — COMPREHENSIVE METABOLIC PANEL
ALT: 22 IU/L (ref 0–32)
AST: 18 IU/L (ref 0–40)
Albumin: 4.1 g/dL (ref 4.0–5.0)
Alkaline Phosphatase: 90 IU/L (ref 44–121)
BUN/Creatinine Ratio: 12 (ref 9–23)
BUN: 11 mg/dL (ref 6–20)
Bilirubin Total: 0.7 mg/dL (ref 0.0–1.2)
CO2: 21 mmol/L (ref 20–29)
Calcium: 9.5 mg/dL (ref 8.7–10.2)
Chloride: 105 mmol/L (ref 96–106)
Creatinine, Ser: 0.95 mg/dL (ref 0.57–1.00)
Globulin, Total: 2.7 g/dL (ref 1.5–4.5)
Glucose: 100 mg/dL — ABNORMAL HIGH (ref 70–99)
Potassium: 4.1 mmol/L (ref 3.5–5.2)
Sodium: 140 mmol/L (ref 134–144)
Total Protein: 6.8 g/dL (ref 6.0–8.5)
eGFR: 84 mL/min/{1.73_m2} (ref >59)

## 2023-03-13 LAB — HEMOGLOBIN A1C
Est. average glucose Bld gHb Est-mCnc: 97 mg/dL
Hgb A1c MFr Bld: 5 % (ref 4.8–5.6)

## 2023-03-13 LAB — TSH: TSH: 2.89 u[IU]/mL (ref 0.450–4.500)

## 2023-03-18 NOTE — Progress Notes (Unsigned)
      GYNECOLOGY OFFICE PROCEDURE NOTE  Dennise Hersey is a 27 y.o. G1P0100 here for Nexplanon removal.  Last pap smear was on 10/03/2021 and was normal.  No other gynecologic concerns.  Nexplanon Removal Patient identified, informed consent performed, consent signed.   Appropriate time out taken. Nexplanon site identified.  Area prepped in usual sterile fashon. One ml of 1% lidocaine was used to anesthetize the area at the distal end of the implant. A small stab incision was made right beside the implant on the distal portion.  The Nexplanon rod was grasped using hemostats and removed without difficulty.  There was minimal blood loss. There were no complications.  3 ml of 1% lidocaine was injected around the incision for post-procedure analgesia.  Steri-strips were applied over the small incision.  A pressure bandage was applied to reduce any bruising.  The patient tolerated the procedure well and was given post procedure instructions.  Patient is planning to use *** for contraception/attempt conception.     Hildred Laser, MD [facility]

## 2023-03-19 ENCOUNTER — Encounter: Payer: Self-pay | Admitting: Obstetrics and Gynecology

## 2023-03-19 ENCOUNTER — Ambulatory Visit (INDEPENDENT_AMBULATORY_CARE_PROVIDER_SITE_OTHER): Payer: BC Managed Care – PPO | Admitting: Obstetrics and Gynecology

## 2023-03-19 VITALS — BP 108/66 | HR 88 | Resp 16 | Ht 64.0 in | Wt 303.7 lb

## 2023-03-19 DIAGNOSIS — Z3046 Encounter for surveillance of implantable subdermal contraceptive: Secondary | ICD-10-CM

## 2023-03-19 DIAGNOSIS — Z30011 Encounter for initial prescription of contraceptive pills: Secondary | ICD-10-CM

## 2023-03-19 DIAGNOSIS — R21 Rash and other nonspecific skin eruption: Secondary | ICD-10-CM

## 2023-03-19 MED ORDER — SLYND 4 MG PO TABS: 1.0000 | ORAL_TABLET | Freq: Every day | ORAL | 3 refills | Status: DC

## 2023-03-27 DIAGNOSIS — L509 Urticaria, unspecified: Secondary | ICD-10-CM | POA: Diagnosis not present

## 2023-05-09 ENCOUNTER — Telehealth: Payer: Self-pay | Admitting: Obstetrics and Gynecology

## 2023-05-09 ENCOUNTER — Other Ambulatory Visit: Payer: Self-pay

## 2023-05-09 DIAGNOSIS — N912 Amenorrhea, unspecified: Secondary | ICD-10-CM

## 2023-05-09 NOTE — Telephone Encounter (Signed)
The patient is calling today wanting to know if she can have a blood pregnancy test. The patient reports no menstrual cycle for 3 months and all home test are coming back negative. The patient reports pregnancy symptoms. I offered scheduling with another provider. The patient prefers to see Dr Valentino Saxon but is willing to another provider.

## 2023-05-09 NOTE — Telephone Encounter (Signed)
Pt calling; wants to have a blood preg test done.  [phone]  Appt already made for pt.

## 2023-05-10 ENCOUNTER — Encounter: Payer: Self-pay | Admitting: Licensed Practical Nurse

## 2023-05-10 ENCOUNTER — Ambulatory Visit (INDEPENDENT_AMBULATORY_CARE_PROVIDER_SITE_OTHER): Payer: BC Managed Care – PPO | Admitting: Licensed Practical Nurse

## 2023-05-10 VITALS — BP 134/87 | HR 86 | Ht 64.0 in | Wt 306.6 lb

## 2023-05-10 DIAGNOSIS — N926 Irregular menstruation, unspecified: Secondary | ICD-10-CM

## 2023-05-10 DIAGNOSIS — N6452 Nipple discharge: Secondary | ICD-10-CM | POA: Diagnosis not present

## 2023-05-10 DIAGNOSIS — N912 Amenorrhea, unspecified: Secondary | ICD-10-CM

## 2023-05-10 NOTE — Progress Notes (Signed)
SUBJECTIVE: Here with her fiance for a serum pregnancy test Nicole Leonard had a nexplanon removed on 7/30. She was on her cycle at that time, she has not had a cycle since. Her cycles were irregular while using Neplanon. When not on hormones her cycles are monthly.  Yesterday she noticed clear discharge form her left nipple. She is aware of pulse in neck (this happened last time she was pregnant). She took 2 home pregnancies tests and they were negative. She desires pregnancy.   Elevated Blood Pressure: she did not take her medication this morning, she states that her BP comes down after taking medication but will still be high, currently she does not  have a provider that manages her [facility], she does have an apt in Oct with a new PCP.   OBJECTIVE: BP 134/87   Pulse 86   Ht 5\' 4"  (1.626 m)   Wt (!) 306 lb 9.6 oz (139.1 kg)   BMI 52.63 kg/m  G1P0100 GEN: NAD PE not necessary for purpose of visit, pt in agreement    ASSESSMENT: Amenorrhea   PLAN: Discussed her missing period could be related to recent Nexplanon use.  -Beta and Prolactin level collected -this CNM to contact pt with lab results   Carie Caddy, CNM  [facility]  05/10/23  12:28 PM

## 2023-05-11 LAB — BETA HCG QUANT (REF LAB): hCG Quant: <1 m[IU]/mL

## 2023-05-11 LAB — PROLACTIN: Prolactin: 17.7 ng/mL (ref 4.8–33.4)

## 2023-05-14 ENCOUNTER — Telehealth: Payer: Self-pay

## 2023-05-14 NOTE — Telephone Encounter (Signed)
New patient visit with Dr. Ozella Rocks on 05/23/23.  Confirmed with patient no personal cardiac history.  Directions to the office provided.  Directions to the office provided.

## 2023-05-23 ENCOUNTER — Other Ambulatory Visit (HOSPITAL_COMMUNITY): Payer: Self-pay

## 2023-05-23 ENCOUNTER — Ambulatory Visit: Payer: BC Managed Care – PPO | Attending: Cardiology | Admitting: Cardiology

## 2023-05-23 ENCOUNTER — Telehealth: Payer: Self-pay | Admitting: Pharmacy Technician

## 2023-05-23 ENCOUNTER — Encounter: Payer: Self-pay | Admitting: Cardiology

## 2023-05-23 VITALS — BP 124/94 | HR 79 | Ht 64.0 in | Wt 307.8 lb

## 2023-05-23 DIAGNOSIS — F17200 Nicotine dependence, unspecified, uncomplicated: Secondary | ICD-10-CM

## 2023-05-23 DIAGNOSIS — I10 Essential (primary) hypertension: Secondary | ICD-10-CM | POA: Diagnosis not present

## 2023-05-23 DIAGNOSIS — R072 Precordial pain: Secondary | ICD-10-CM | POA: Diagnosis not present

## 2023-05-23 DIAGNOSIS — IMO0001 Reserved for inherently not codable concepts without codable children: Secondary | ICD-10-CM

## 2023-05-23 MED ORDER — SEMAGLUTIDE-WEIGHT MANAGEMENT 0.5 MG/0.5ML ~~LOC~~ SOAJ: 0.5000 mg | SUBCUTANEOUS | 0 refills | Status: AC

## 2023-05-23 MED ORDER — SEMAGLUTIDE-WEIGHT MANAGEMENT 1.7 MG/0.75ML ~~LOC~~ SOAJ: 1.7000 mg | SUBCUTANEOUS | 0 refills | Status: DC

## 2023-05-23 MED ORDER — SEMAGLUTIDE-WEIGHT MANAGEMENT 2.4 MG/0.75ML ~~LOC~~ SOAJ: 2.4000 mg | SUBCUTANEOUS | 3 refills | Status: DC

## 2023-05-23 MED ORDER — SEMAGLUTIDE-WEIGHT MANAGEMENT 1 MG/0.5ML ~~LOC~~ SOAJ: 1.0000 mg | SUBCUTANEOUS | 0 refills | Status: AC

## 2023-05-23 NOTE — Telephone Encounter (Signed)
Pharmacy Patient Advocate Encounter   Received notification from CoverMyMeds that prior authorization for wegovy is required/requested.   Insurance verification completed.   The patient is insured through [facility]  .   Per test claim: PA required; PA submitted to [employer] [place] via CoverMyMeds Key/confirmation #/[id number] [facility] Status is pending

## 2023-05-23 NOTE — Patient Instructions (Signed)
Medication Instructions:   Start taking Wegovy  Month 1: inject 0.25 mg once a week for 4 weeks (SAMPLE)  Month 2: inject 0.5 mg once a week for 4 weeks . (Call or send [place] a MyChart message when you are on week 2, so we can send your next dose in for you).  Month 3: inject 1 mg once a week for 4 weeks  Month 4: inject 1.7 mg once a week for 4 weeks  Month 5 and beyond: 2.4 mg once a week (maintenance dose)  *If you need a refill on your cardiac medications before your next appointment, please call your pharmacy*   Lab Work:  None Ordered  If you have labs (blood work) drawn today and your tests are completely normal, you will receive your results only by: MyChart Message (if you have MyChart) OR A paper copy in the mail If you have any lab test that is abnormal or we need to change your treatment, we will call you to review the results.   Testing/Procedures:  Your physician has requested that you have an echocardiogram. Echocardiography is a painless test that uses sound waves to create images of your heart. It provides your doctor with information about the size and shape of your heart and how well your heart's chambers and valves are working. This procedure takes approximately one hour. There are no restrictions for this procedure. Please do NOT wear cologne, perfume, aftershave, or lotions (deodorant is allowed). Please arrive 15 minutes prior to your appointment time.    Follow-Up: At [facility], you and your health needs are our priority.  As part of our continuing mission to provide you with exceptional heart care, we have created designated Provider Care Teams.  These Care Teams include your primary Cardiologist (physician) and Advanced Practice Providers (APPs -  Physician Assistants and Nurse Practitioners) who all work together to provide you with the care you need, when you need it.  We recommend signing up for the patient portal called "MyChart".  Sign up  information is provided on this After Visit Summary.  MyChart is used to connect with patients for Virtual Visits (Telemedicine).  Patients are able to view lab/test results, encounter notes, upcoming appointments, etc.  Non-urgent messages can be sent to your provider as well.   To learn more about what you can do with MyChart, go to [email or web address].    Your next appointment:    After Echocardiogram   Provider:   Debbe Odea, MD ONLY

## 2023-05-23 NOTE — Progress Notes (Signed)
Cardiology Office Note:    Date:  05/23/2023   ID:  Nicole Leonard, DOB 19-Oct-1995, MRN [id number]  PCP:  Patient, No Pcp Per   [facility] Providers Cardiologist:  Debbe Odea, MD     Referring MD: Hildred Laser, MD   Chief Complaint  Patient presents with   New Patient (Initial Visit)    Referred for cardiac evaluation of hypertension and Intermittent left-sided chest pain with no known cardiac history.      Nicole Leonard is a 27 y.o. female who is being seen today for the evaluation of hypertension at the request of Hildred Laser, MD.   History of Present Illness:    Nicole Leonard is a 27 y.o. female with a hx of hypertension, current smoker x 10+ years, obesity who presents due to chest pain.  States having on and off chest pains for years, usually not associated with exertion.  Symptoms seem to have worsened of late.  Pain typically gets worse during active smoking and if patient continues smoking.  Sometimes stretching her arms out works eases pain.  Father had a heart attack in his 27s.  Blood pressure well-controlled on amlodipine.  Past Medical History:  Diagnosis Date   Hypertension    Patient denies medical problems    PID (pelvic inflammatory disease)    2018    Past Surgical History:  Procedure Laterality Date   denies      Current Medications: Current Meds  Medication Sig   amLODipine (NORVASC) 5 MG tablet Take 1 tablet (5 mg total) by mouth daily.   [START ON 06/21/2023] Semaglutide-Weight Management 0.5 MG/0.5ML SOAJ Inject 0.5 mg into the skin once a week for 28 days.   [START ON 07/20/2023] Semaglutide-Weight Management 1 MG/0.5ML SOAJ Inject 1 mg into the skin once a week for 28 days.   [START ON 08/18/2023] Semaglutide-Weight Management 1.7 MG/0.75ML SOAJ Inject 1.7 mg into the skin once a week for 28 days.   [START ON 09/16/2023] Semaglutide-Weight Management 2.4 MG/0.75ML SOAJ Inject 2.4 mg into the skin once a week.      Allergies:   Lavender oil and Penicillins   Social History   Socioeconomic History   Marital status: Single    Spouse name: Not on file   Number of children: Not on file   Years of education: Not on file   Highest education level: 12th grade  Occupational History   Not on file  Tobacco Use   Smoking status: Every Day    Current packs/day: 0.00    Average packs/day: 0.5 packs/day for 10.0 years (5.0 ttl pk-yrs)    Types: Cigarettes    Start date: 08/05/2011    Last attempt to quit: 08/04/2021    Years since quitting: 1.8   Smokeless tobacco: Never  Vaping Use   Vaping status: Former  Substance and Sexual Activity   Alcohol use: Yes    Alcohol/week: 5.0 standard drinks of alcohol    Types: 5 Shots of liquor per week    Comment: last use 02/20/22 3x/yr   Drug use: Yes    Types: Marijuana    Comment: 7 days a week   Sexual activity: Yes    Partners: Male    Birth control/protection: Implant  Other Topics Concern   Not on file  Social History Narrative   Not on file   Social Determinants of Health   Financial Resource Strain: Low Risk  (05/21/2023)   Overall Financial Resource Strain (CARDIA)  Difficulty of Paying Living Expenses: Not very hard  Food Insecurity: Food Insecurity Present (05/21/2023)   Hunger Vital Sign    Worried About Running Out of Food in the Last Year: Sometimes true    Ran Out of Food in the Last Year: Never true  Transportation Needs: No Transportation Needs (05/21/2023)   PRAPARE - Administrator, Civil Service (Medical): No    Lack of Transportation (Non-Medical): No  Physical Activity: Insufficiently Active (05/21/2023)   Exercise Vital Sign    Days of Exercise per Week: 3 days    Minutes of Exercise per Session: 10 min  Stress: Stress Concern Present (05/21/2023)   [employer] of Occupational Health - Occupational Stress Questionnaire    Feeling of Stress : To some extent  Social Connections: Moderately Isolated  (05/21/2023)   Social Connection and Isolation Panel [NHANES]    Frequency of Communication with Friends and Family: Three times a week    Frequency of Social Gatherings with Friends and Family: Once a week    Attends Religious Services: 1 to 4 times per year    Active Member of [employer] or Organizations: No    Attends Engineer, structural: Not on file    Marital Status: Never married     Family History: The patient's family history includes Diabetes in her brother, father, maternal grandmother, and mother; Healthy in her sister; Heart attack in her father.  ROS:   Please see the history of present illness.     All other systems reviewed and are negative.  EKGs/Labs/Other Studies Reviewed:    The following studies were reviewed today:  EKG Interpretation Date/Time:  Thursday May 23 2023 09:49:29 EDT Ventricular Rate:  79 PR Interval:  160 QRS Duration:  92 QT Interval:  396 QTC Calculation: 454 R Axis:   33  Text Interpretation: Normal sinus rhythm Normal ECG Confirmed by Debbe Odea ([id number]) on 05/23/2023 10:12:49 AM    Recent Labs: 03/12/2023: ALT 22; BUN 11; Creatinine, Ser 0.95; Hemoglobin 13.6; Platelets 391; Potassium 4.1; Sodium 140; TSH 2.890  Recent Lipid Panel    Component Value Date/Time   CHOL 196 03/12/2023 0939   TRIG 140 03/12/2023 0939   HDL 44 03/12/2023 0939   CHOLHDL 4.5 (H) 03/12/2023 0939   LDLCALC 127 (H) 03/12/2023 0939     Risk Assessment/Calculations:     Physical Exam:    VS:  BP (!) 124/94 (BP Location: Left Arm, Patient Position: Sitting, Cuff Size: Large)   Pulse 79   Ht 5\' 4"  (1.626 m)   Wt (!) 307 lb 12.8 oz (139.6 kg)   SpO2 98%   BMI 52.83 kg/m     Wt Readings from Last 3 Encounters:  05/23/23 (!) 307 lb 12.8 oz (139.6 kg)  05/10/23 (!) 306 lb 9.6 oz (139.1 kg)  03/19/23 (!) 303 lb 11.2 oz (137.8 kg)     GEN:  Well nourished, well developed in no acute distress HEENT: Normal NECK: No JVD; No carotid  bruits CARDIAC: RRR, no murmurs, rubs, gallops RESPIRATORY:  Clear to auscultation without rales, wheezing or rhonchi  ABDOMEN: Soft, non-tender, non-distended MUSCULOSKELETAL:  No edema; No deformity  SKIN: Warm and dry NEUROLOGIC:  Alert and oriented x 3 PSYCHIATRIC:  Normal affect   ASSESSMENT:    1. Precordial pain   2. Morbid obesity (HCC) 301 lbs   3. Smoking   4. Primary hypertension    PLAN:    In order of problems listed above:  Chest pain, appears noncardiac, associated with smoking.  Relief with outstretched arm and stopping smoking.  Obtain echo to rule out any structural abnormalities. Morbid obesity, low-calorie diet, weight loss advised.  Start [employer]. Current smoker, smoking cessation advised. Hypertension, BP controlled, continue Norvasc 5 mg daily.  Follow-up after echocardiogram      Medication Adjustments/Labs and Tests Ordered: Current medicines are reviewed at length with the patient today.  Concerns regarding medicines are outlined above.  Orders Placed This Encounter  Procedures   EKG 12-Lead   ECHOCARDIOGRAM COMPLETE   Meds ordered this encounter  Medications   Semaglutide-Weight Management 0.5 MG/0.5ML SOAJ    Sig: Inject 0.5 mg into the skin once a week for 28 days.    Dispense:  2 mL    Refill:  0   Semaglutide-Weight Management 1 MG/0.5ML SOAJ    Sig: Inject 1 mg into the skin once a week for 28 days.    Dispense:  2 mL    Refill:  0   Semaglutide-Weight Management 1.7 MG/0.75ML SOAJ    Sig: Inject 1.7 mg into the skin once a week for 28 days.    Dispense:  3 mL    Refill:  0   Semaglutide-Weight Management 2.4 MG/0.75ML SOAJ    Sig: Inject 2.4 mg into the skin once a week.    Dispense:  3 mL    Refill:  3    Patient Instructions  Medication Instructions:   Start taking Wegovy  Month 1: inject 0.25 mg once a week for 4 weeks (SAMPLE)  Month 2: inject 0.5 mg once a week for 4 weeks . (Call or send [place] a MyChart message when you  are on week 2, so we can send your next dose in for you).  Month 3: inject 1 mg once a week for 4 weeks  Month 4: inject 1.7 mg once a week for 4 weeks  Month 5 and beyond: 2.4 mg once a week (maintenance dose)  *If you need a refill on your cardiac medications before your next appointment, please call your pharmacy*   Lab Work:  None Ordered  If you have labs (blood work) drawn today and your tests are completely normal, you will receive your results only by: MyChart Message (if you have MyChart) OR A paper copy in the mail If you have any lab test that is abnormal or we need to change your treatment, we will call you to review the results.   Testing/Procedures:  Your physician has requested that you have an echocardiogram. Echocardiography is a painless test that uses sound waves to create images of your heart. It provides your doctor with information about the size and shape of your heart and how well your heart's chambers and valves are working. This procedure takes approximately one hour. There are no restrictions for this procedure. Please do NOT wear cologne, perfume, aftershave, or lotions (deodorant is allowed). Please arrive 15 minutes prior to your appointment time.    Follow-Up: At [facility], you and your health needs are our priority.  As part of our continuing mission to provide you with exceptional heart care, we have created designated Provider Care Teams.  These Care Teams include your primary Cardiologist (physician) and Advanced Practice Providers (APPs -  Physician Assistants and Nurse Practitioners) who all work together to provide you with the care you need, when you need it.  We recommend signing up for the patient portal called "MyChart".  Sign up information  is provided on this After Visit Summary.  MyChart is used to connect with patients for Virtual Visits (Telemedicine).  Patients are able to view lab/test results, encounter notes, upcoming  appointments, etc.  Non-urgent messages can be sent to your provider as well.   To learn more about what you can do with MyChart, go to [email or web address].    Your next appointment:    After Echocardiogram   Provider:   Debbe Odea, MD ONLY     Signed, Debbe Odea, MD  05/23/2023 10:46 AM    [facility]

## 2023-05-24 ENCOUNTER — Ambulatory Visit: Payer: BC Managed Care – PPO | Admitting: Nurse Practitioner

## 2023-05-24 ENCOUNTER — Other Ambulatory Visit (HOSPITAL_COMMUNITY): Payer: Self-pay

## 2023-05-24 NOTE — Progress Notes (Deleted)
   There were no vitals taken for this visit.   Subjective:    Patient ID: Nicole Leonard, female    DOB: 04-29-1996, 27 y.o.   MRN: [id number]  HPI: Nicole Leonard is a 27 y.o. female  No chief complaint on file.  Establish care: her last physical was ***.  Medical history includes ***.  Family history includes ***.  Health maintenance ***.   Hypertension: -Medications: amlodipine 5 mg daily -Patient is compliant with above medications and reports no side effects. -Checking BP at home (average): *** -Highest BP at home: *** -Lowest BP at home: *** -Denies any SOB, CP, vision changes, LE edema or symptoms of hypotension -Diet: recommend DASH diet  -Exercise: recommend 150 min of physical activity weekly    Obesity:  Current weight : *** BMI: *** Highest weight:*** Treatment Tried: lifestyle modification, Dr Myriam Forehand- Sandie Ano is working on getting patient approved for [employer].  Comorbidities: HTN , HLD  HLD: -Medications: none -Patient is working on lifestyle modification -Last lipid panel:  Lipid Panel     Component Value Date/Time   CHOL 196 03/12/2023 0939   TRIG 140 03/12/2023 0939   HDL 44 03/12/2023 0939   CHOLHDL 4.5 (H) 03/12/2023 0939   LDLCALC 127 (H) 03/12/2023 0939   LABVLDL 25 03/12/2023 0939     Relevant past medical, surgical, family and social history reviewed and updated as indicated. Interim medical history since our last visit reviewed. Allergies and medications reviewed and updated.  Review of Systems  Constitutional: Negative for fever or weight change.  Respiratory: Negative for cough and shortness of breath.   Cardiovascular: Negative for chest pain or palpitations.  Gastrointestinal: Negative for abdominal pain, no bowel changes.  Musculoskeletal: Negative for gait problem or joint swelling.  Skin: Negative for rash.  Neurological: Negative for dizziness or headache.  No other specific complaints in a complete review of systems (except as  listed in HPI above).      Objective:    There were no vitals taken for this visit.  Wt Readings from Last 3 Encounters:  05/23/23 (!) 307 lb 12.8 oz (139.6 kg)  05/10/23 (!) 306 lb 9.6 oz (139.1 kg)  03/19/23 (!) 303 lb 11.2 oz (137.8 kg)    Physical Exam  Constitutional: Patient appears well-developed and well-nourished. Obese *** No distress.  HEENT: head atraumatic, normocephalic, pupils equal and reactive to light, ears ***, neck supple, throat within normal limits Cardiovascular: Normal rate, regular rhythm and normal heart sounds.  No murmur heard. No BLE edema. Pulmonary/Chest: Effort normal and breath sounds normal. No respiratory distress. Abdominal: Soft.  There is no tenderness. Psychiatric: Patient has a normal mood and affect. behavior is normal. Judgment and thought content normal.  Results for orders placed or performed in visit on 05/10/23  Beta HCG, Quant  Result Value Ref Range   hCG Quant <1 mIU/mL  Prolactin  Result Value Ref Range   Prolactin 17.7 4.8 - 33.4 ng/mL      Assessment & Plan:   Problem List Items Addressed This Visit   None    Follow up plan: No follow-ups on file.

## 2023-05-24 NOTE — Telephone Encounter (Signed)
Pharmacy Patient Advocate Encounter  Received notification from [facility]  that Prior Authorization for wegovy has been APPROVED from 04/24/23 to 05/23/24. Ran test claim, Copay is $24.99. This test claim was processed through [facility]- copay amounts may vary at other pharmacies due to pharmacy/plan contracts, or as the patient moves through the different stages of their insurance plan.   PA #/Case ID/Reference #: [id number]

## 2023-05-29 ENCOUNTER — Telehealth: Payer: Self-pay

## 2023-05-29 MED ORDER — WEGOVY 0.25 MG/0.5ML ~~LOC~~ SOAJ: 0.2500 mg | SUBCUTANEOUS | 0 refills | Status: AC

## 2023-05-29 NOTE — Telephone Encounter (Signed)
Placed order for sample that was given 05/23/2023

## 2023-06-12 ENCOUNTER — Ambulatory Visit: Payer: BC Managed Care – PPO | Attending: Cardiology

## 2023-07-02 ENCOUNTER — Ambulatory Visit: Payer: BC Managed Care – PPO | Admitting: Cardiology

## 2023-07-11 ENCOUNTER — Ambulatory Visit (INDEPENDENT_AMBULATORY_CARE_PROVIDER_SITE_OTHER): Payer: BC Managed Care – PPO | Admitting: Nurse Practitioner

## 2023-07-11 ENCOUNTER — Other Ambulatory Visit: Payer: Self-pay

## 2023-07-11 ENCOUNTER — Encounter: Payer: Self-pay | Admitting: Nurse Practitioner

## 2023-07-11 VITALS — BP 132/84 | HR 98 | Temp 97.8°F | Resp 16 | Ht 64.0 in | Wt 303.8 lb

## 2023-07-11 DIAGNOSIS — I10 Essential (primary) hypertension: Secondary | ICD-10-CM | POA: Diagnosis not present

## 2023-07-11 DIAGNOSIS — E782 Mixed hyperlipidemia: Secondary | ICD-10-CM | POA: Diagnosis not present

## 2023-07-11 DIAGNOSIS — Z7689 Persons encountering health services in other specified circumstances: Secondary | ICD-10-CM

## 2023-07-11 DIAGNOSIS — R058 Other specified cough: Secondary | ICD-10-CM

## 2023-07-11 DIAGNOSIS — K219 Gastro-esophageal reflux disease without esophagitis: Secondary | ICD-10-CM

## 2023-07-11 DIAGNOSIS — L509 Urticaria, unspecified: Secondary | ICD-10-CM

## 2023-07-11 DIAGNOSIS — R0683 Snoring: Secondary | ICD-10-CM

## 2023-07-11 MED ORDER — LABETALOL HCL 100 MG PO TABS: 100.0000 mg | ORAL_TABLET | Freq: Two times a day (BID) | ORAL | 1 refills | Status: DC

## 2023-07-11 MED ORDER — OMEPRAZOLE 20 MG PO CPDR: 20.0000 mg | DELAYED_RELEASE_CAPSULE | Freq: Every day | ORAL | 1 refills | Status: DC

## 2023-07-11 NOTE — Progress Notes (Signed)
BP 132/84   Pulse 98   Temp 97.8 F (36.6 C) (Oral)   Resp 16   Ht 5\' 4"  (1.626 m)   Wt (!) 303 lb 12.8 oz (137.8 kg)   LMP 07/08/2023   SpO2 96%   BMI 52.15 kg/m    Subjective:    Patient ID: Nicole Leonard, female    DOB: 02/10/1996, 27 y.o.   MRN: [id number]  HPI: Nicole Leonard is a 27 y.o. female  Chief Complaint  Patient presents with   Establish Care   Rash    Skin rash   Allergic Rhinitis    Cough    For 1 month   Hypertension   Discussed the use of AI scribe software for clinical note transcription with the patient, who gave verbal consent to proceed.  History of Present Illness   The patient, with a history of hypertension, presents with a generalized rash and a nocturnal cough. The rash, which is associated with a sensation of heat, has been managed with daily Zyrtec. If the Zyrtec is not taken, the patient develops welts. The patient has not had allergy testing in the past. The cough, which has been present for approximately four months, is only present at night. The patient reports waking from sleep due to the severity of the cough and has resorted to sleeping with a cough drop in her mouth. The patient denies any daytime cough or difficulty breathing. The patient also reports never feeling well rested, but attributes this to her work schedule.  The patient is currently taking amlodipine 5mg  daily for hypertension and Wegovy for weight loss. The Reginal Lutes has been associated with some nausea. The patient is due to increase the dose of Wegovy in the coming week. The patient also reports a history of heartburn, which has worsened since starting the [facility].       07/11/2023    8:08 AM 03/19/2023   11:15 AM 03/12/2023    8:58 AM 05/10/2022    8:39 AM 04/06/2022   10:45 AM  Depression screen PHQ 2/9  Decreased Interest 0 0 0 0 1  Down, Depressed, Hopeless 0 0 0 0 2  PHQ - 2 Score 0 0 0 0 3  Altered sleeping     0  Tired, decreased energy     1  Change in  appetite     1  Feeling bad or failure about yourself      2  Trouble concentrating     1  Moving slowly or fidgety/restless     0  Suicidal thoughts     1  PHQ-9 Score     9  Difficult doing work/chores     Somewhat difficult    Relevant past medical, surgical, family and social history reviewed and updated as indicated. Interim medical history since our last visit reviewed. Allergies and medications reviewed and updated.  Review of Systems  Constitutional: Negative for fever or weight change.  Respiratory: positive for cough and negative shortness of breath.   Cardiovascular: Negative for chest pain or palpitations.  Gastrointestinal: Negative for abdominal pain, no bowel changes.  Musculoskeletal: Negative for gait problem or joint swelling.  Skin: Negative for rash.  Neurological: Negative for dizziness or headache.  No other specific complaints in a complete review of systems (except as listed in HPI above).      Objective:    BP 132/84   Pulse 98   Temp 97.8 F (36.6 C) (Oral)  Resp 16   Ht 5\' 4"  (1.626 m)   Wt (!) 303 lb 12.8 oz (137.8 kg)   LMP 07/08/2023   SpO2 96%   BMI 52.15 kg/m   Wt Readings from Last 3 Encounters:  07/11/23 (!) 303 lb 12.8 oz (137.8 kg)  05/23/23 (!) 307 lb 12.8 oz (139.6 kg)  05/10/23 (!) 306 lb 9.6 oz (139.1 kg)    Physical Exam  Constitutional: Patient appears well-developed and well-nourished. Obese  No distress.  HEENT: head atraumatic, normocephalic, pupils equal and reactive to light, neck supple Cardiovascular: Normal rate, regular rhythm and normal heart sounds.  No murmur heard. No BLE edema. Pulmonary/Chest: Effort normal and breath sounds normal. No respiratory distress. Abdominal: Soft.  There is no tenderness. Psychiatric: Patient has a normal mood and affect. behavior is normal. Judgment and thought content normal.  Results for orders placed or performed in visit on 05/10/23  Beta HCG, Quant  Result Value Ref Range    hCG Quant <1 mIU/mL  Prolactin  Result Value Ref Range   Prolactin 17.7 4.8 - 33.4 ng/mL      Assessment & Plan:   Problem List Items Addressed This Visit       Cardiovascular and Mediastinum   Hypertension 148/92 on 05/10/22 - Primary   Relevant Medications   labetalol (NORMODYNE) 100 MG tablet     Other   Morbid obesity (HCC) 301 lbs   Mixed hyperlipidemia   Relevant Medications   labetalol (NORMODYNE) 100 MG tablet   Other Visit Diagnoses     Encounter to establish care       Urticaria       Relevant Orders   Ambulatory referral to ENT   Hives       Relevant Orders   Ambulatory referral to ENT   Snores       Relevant Orders   Ambulatory referral to Pulmonology   Other cough       Relevant Medications   omeprazole (PRILOSEC) 20 MG capsule   Other Relevant Orders   Ambulatory referral to Pulmonology   Gastroesophageal reflux disease without esophagitis       Relevant Medications   omeprazole (PRILOSEC) 20 MG capsule       Assessment and Plan    Hypertension Blood pressure readings at home are elevated (156/170), but today's reading is borderline (132/84). Patient is currently on Amlodipine 5mg  daily. Discussed the importance of sodium restriction in diet. -Discontinue Amlodipine due to possible association with rash. -Start Labetalol 100mg  twice daily, safe for potential pregnancy.  Rash Generalized rash that improves with Zyrtec. No prior allergy testing. -Refer for allergy testing. -Discontinue Zyrtec on 07/15/2023 to assess if rash returns after discontinuation of Amlodipine. -Advise to use Pepcid in conjunction with Zyrtec if rash returns.  Chronic Cough Nighttime cough and wheezing for the past 4 months, possibly suggestive of sleep apnea. -Refer for sleep study.  Acid Reflux Patient reports symptoms of acid reflux, particularly since starting Wegovy. -Start Omeprazole 20mg  daily.  Weight Management Patient is currently on Wegovy, progressing to  next dose level. Some nausea reported. -Continue [id number] as tolerated.  Follow-up in 4 weeks with lab work.        Follow up plan: Return in about 4 weeks (around 08/08/2023) for follow up.

## 2023-07-12 ENCOUNTER — Ambulatory Visit: Payer: BC Managed Care – PPO | Attending: Cardiology

## 2023-07-12 DIAGNOSIS — R072 Precordial pain: Secondary | ICD-10-CM | POA: Diagnosis not present

## 2023-07-12 LAB — ECHOCARDIOGRAM COMPLETE
AR max vel: 3.68 cm2
AV Area VTI: 3.64 cm2
AV Area mean vel: 3.54 cm2
AV Mean grad: 4 mm[Hg]
AV Peak grad: 6.8 mm[Hg]
Ao pk vel: 1.3 m/s
Area-P 1/2: 4.17 cm2
S' Lateral: 3.1 cm

## 2023-07-12 MED ORDER — PERFLUTREN LIPID MICROSPHERE
1.0000 mL | INTRAVENOUS | Status: AC | PRN
  Administered 2023-07-12: 2 mL via INTRAVENOUS

## 2023-07-12 NOTE — Telephone Encounter (Signed)
Patient stopped by for update States she has finished the sample She moved up from 0.5mg  to 1mg  and that everything went fine

## 2023-07-17 ENCOUNTER — Encounter: Payer: Self-pay | Admitting: Internal Medicine

## 2023-07-22 ENCOUNTER — Encounter: Payer: Self-pay | Admitting: Cardiology

## 2023-07-22 ENCOUNTER — Ambulatory Visit: Payer: BC Managed Care – PPO | Attending: Cardiology | Admitting: Cardiology

## 2023-07-22 VITALS — BP 131/89 | HR 86 | Ht 64.0 in | Wt 303.4 lb

## 2023-07-22 DIAGNOSIS — F17200 Nicotine dependence, unspecified, uncomplicated: Secondary | ICD-10-CM | POA: Diagnosis not present

## 2023-07-22 DIAGNOSIS — I10 Essential (primary) hypertension: Secondary | ICD-10-CM | POA: Diagnosis not present

## 2023-07-22 DIAGNOSIS — R072 Precordial pain: Secondary | ICD-10-CM

## 2023-07-22 MED ORDER — ONDANSETRON HCL 4 MG PO TABS: 4.0000 mg | ORAL_TABLET | ORAL | 3 refills | Status: DC | PRN

## 2023-07-22 NOTE — Progress Notes (Signed)
Cardiology Office Note:    Date:  07/22/2023   ID:  Nicole Leonard, DOB 03-10-1996, MRN [id number]  PCP:  Berniece Salines, FNP   [facility] Providers Cardiologist:  Debbe Odea, MD     Referring MD: No ref. provider found   Chief Complaint  Patient presents with   Follow-up    Discuss echo results.  Patient reports a lot of nausea.       History of Present Illness:    Nicole Leonard is a 27 y.o. female with a hx of hypertension, current smoker x 10+ years, obesity who presents for follow-up.  Originally seen with symptoms of chest pain and morbid obesity.  Etiology deemed noncardiac associated with stretching her arms.  Echocardiogram was obtained to evaluate any structural abnormalities.  She was started on Wegovy to help with weight loss.  Endorses nausea, otherwise tolerating Wegovy.  She still smokes.   Past Medical History:  Diagnosis Date   Allergy    Hypertension    Patient denies medical problems    PID (pelvic inflammatory disease)    2018    Past Surgical History:  Procedure Laterality Date   denies      Current Medications: Current Meds  Medication Sig   cetirizine (ZYRTEC) 10 MG tablet Take 10 mg by mouth daily.   labetalol (NORMODYNE) 100 MG tablet Take 1 tablet (100 mg total) by mouth 2 (two) times daily.   omeprazole (PRILOSEC) 20 MG capsule Take 1 capsule (20 mg total) by mouth daily.   ondansetron (ZOFRAN) 4 MG tablet Take 1 tablet (4 mg total) by mouth every 4 (four) hours as needed for nausea or vomiting.     Allergies:   Lavender oil and Penicillins   Social History   Socioeconomic History   Marital status: Single    Spouse name: Not on file   Number of children: Not on file   Years of education: Not on file   Highest education level: 12th grade  Occupational History   Not on file  Tobacco Use   Smoking status: Every Day    Current packs/day: 0.00    Average packs/day: 0.5 packs/day for 10.0 years (5.0 ttl  pk-yrs)    Types: Cigarettes    Start date: 08/05/2011    Last attempt to quit: 08/04/2021    Years since quitting: 1.9   Smokeless tobacco: Never  Vaping Use   Vaping status: Never Used  Substance and Sexual Activity   Alcohol use: Yes    Alcohol/week: 5.0 standard drinks of alcohol    Types: 5 Shots of liquor per week    Comment: last use 02/20/22 3x/yr   Drug use: Yes    Types: Marijuana    Comment: 7 days a week   Sexual activity: Yes    Partners: Male  Other Topics Concern   Not on file  Social History Narrative   Not on file   Social Determinants of Health   Financial Resource Strain: Low Risk  (07/07/2023)   Overall Financial Resource Strain (CARDIA)    Difficulty of Paying Living Expenses: Not very hard  Food Insecurity: Food Insecurity Present (07/07/2023)   Hunger Vital Sign    Worried About Running Out of Food in the Last Year: Sometimes true    Ran Out of Food in the Last Year: Never true  Transportation Needs: No Transportation Needs (07/07/2023)   PRAPARE - Administrator, Civil Service (Medical): No  Lack of Transportation (Non-Medical): No  Physical Activity: Insufficiently Active (07/07/2023)   Exercise Vital Sign    Days of Exercise per Week: 3 days    Minutes of Exercise per Session: 10 min  Stress: Stress Concern Present (07/07/2023)   [employer] of Occupational Health - Occupational Stress Questionnaire    Feeling of Stress : To some extent  Social Connections: Moderately Isolated (07/07/2023)   Social Connection and Isolation Panel [NHANES]    Frequency of Communication with Friends and Family: Three times a week    Frequency of Social Gatherings with Friends and Family: Once a week    Attends Religious Services: 1 to 4 times per year    Active Member of [employer] or Organizations: No    Attends Engineer, structural: Not on file    Marital Status: Never married     Family History: The patient's family history includes  Diabetes in her brother, father, maternal grandmother, and mother; Healthy in her sister; Heart attack in her father.  ROS:   Please see the history of present illness.     All other systems reviewed and are negative.  EKGs/Labs/Other Studies Reviewed:    The following studies were reviewed today:       Recent Labs: 03/12/2023: ALT 22; BUN 11; Creatinine, Ser 0.95; Hemoglobin 13.6; Platelets 391; Potassium 4.1; Sodium 140; TSH 2.890  Recent Lipid Panel    Component Value Date/Time   CHOL 196 03/12/2023 0939   TRIG 140 03/12/2023 0939   HDL 44 03/12/2023 0939   CHOLHDL 4.5 (H) 03/12/2023 0939   LDLCALC 127 (H) 03/12/2023 0939     Risk Assessment/Calculations:     Physical Exam:    VS:  BP 131/89 (BP Location: Left Arm, Patient Position: Sitting, Cuff Size: Large)   Pulse 86   Ht 5\' 4"  (1.626 m)   Wt (!) 303 lb 6.4 oz (137.6 kg)   LMP 07/08/2023   SpO2 98%   BMI 52.08 kg/m     Wt Readings from Last 3 Encounters:  07/22/23 (!) 303 lb 6.4 oz (137.6 kg)  07/11/23 (!) 303 lb 12.8 oz (137.8 kg)  05/23/23 (!) 307 lb 12.8 oz (139.6 kg)     GEN:  Well nourished, well developed in no acute distress HEENT: Normal NECK: No JVD; No carotid bruits CARDIAC: RRR, no murmurs, rubs, gallops RESPIRATORY:  Clear to auscultation without rales, wheezing or rhonchi  ABDOMEN: Soft, non-tender, non-distended MUSCULOSKELETAL:  No edema; No deformity  SKIN: Warm and dry NEUROLOGIC:  Alert and oriented x 3 PSYCHIATRIC:  Normal affect   ASSESSMENT:    1. Precordial pain   2. Morbid obesity (HCC) 301 lbs   3. Smoking   4. Primary hypertension    PLAN:    In order of problems listed above:  Chest pain, appears noncardiac, associated with smoking.  Echo with normal systolic function, EF 60 to 65%.  Smoking cessation strongly advised. Morbid obesity, low-calorie diet, continue Wegovy, increased activity advised.  Zofran as needed given to help with nausea. Current smoker, smoking  cessation advised. Hypertension, BP controlled, continue labetalol 100 mg twice daily.  Follow-up in 6 months.      Medication Adjustments/Labs and Tests Ordered: Current medicines are reviewed at length with the patient today.  Concerns regarding medicines are outlined above.  No orders of the defined types were placed in this encounter.  Meds ordered this encounter  Medications   ondansetron (ZOFRAN) 4 MG tablet    Sig:  Take 1 tablet (4 mg total) by mouth every 4 (four) hours as needed for nausea or vomiting.    Dispense:  30 tablet    Refill:  3    Patient Instructions  Medication Instructions:  Your physician has recommended you make the following change in your medication:   Zofran 4 mg every 4 hours as needed  *If you need a refill on your cardiac medications before your next appointment, please call your pharmacy*   Lab Work: None  If you have labs (blood work) drawn today and your tests are completely normal, you will receive your results only by: MyChart Message (if you have MyChart) OR A paper copy in the mail If you have any lab test that is abnormal or we need to change your treatment, we will call you to review the results.   Testing/Procedures: None   Follow-Up: At [facility], you and your health needs are our priority.  As part of our continuing mission to provide you with exceptional heart care, we have created designated Provider Care Teams.  These Care Teams include your primary Cardiologist (physician) and Advanced Practice Providers (APPs -  Physician Assistants and Nurse Practitioners) who all work together to provide you with the care you need, when you need it.    Your next appointment:   6 month(s)  Provider:   Debbe Odea, MD    Signed, Debbe Odea, MD  07/22/2023 11:16 AM    [facility]

## 2023-07-22 NOTE — Patient Instructions (Signed)
Medication Instructions:  Your physician has recommended you make the following change in your medication:   Zofran 4 mg every 4 hours as needed  *If you need a refill on your cardiac medications before your next appointment, please call your pharmacy*   Lab Work: None  If you have labs (blood work) drawn today and your tests are completely normal, you will receive your results only by: MyChart Message (if you have MyChart) OR A paper copy in the mail If you have any lab test that is abnormal or we need to change your treatment, we will call you to review the results.   Testing/Procedures: None   Follow-Up: At [facility], you and your health needs are our priority.  As part of our continuing mission to provide you with exceptional heart care, we have created designated Provider Care Teams.  These Care Teams include your primary Cardiologist (physician) and Advanced Practice Providers (APPs -  Physician Assistants and Nurse Practitioners) who all work together to provide you with the care you need, when you need it.    Your next appointment:   6 month(s)  Provider:   Debbe Odea, MD

## 2023-07-24 ENCOUNTER — Ambulatory Visit: Payer: BC Managed Care – PPO | Admitting: Nurse Practitioner

## 2023-08-07 NOTE — Progress Notes (Unsigned)
   LMP 07/08/2023    Subjective:    Patient ID: Nicole Leonard, female    DOB: 01-Sep-1995, 27 y.o.   MRN: [id number]  HPI: Nicole Leonard is a 27 y.o. female  No chief complaint on file.   Discussed the use of AI scribe software for clinical note transcription with the patient, who gave verbal consent to proceed.  History of Present Illness           07/11/2023    8:08 AM 03/19/2023   11:15 AM 03/12/2023    8:58 AM  Depression screen PHQ 2/9  Decreased Interest 0 0 0  Down, Depressed, Hopeless 0 0 0  PHQ - 2 Score 0 0 0    Relevant past medical, surgical, family and social history reviewed and updated as indicated. Interim medical history since our last visit reviewed. Allergies and medications reviewed and updated.  Review of Systems  Per HPI unless specifically indicated above     Objective:    LMP 07/08/2023   {Vitals History (Optional):23777} Wt Readings from Last 3 Encounters:  07/22/23 (!) 303 lb 6.4 oz (137.6 kg)  07/11/23 (!) 303 lb 12.8 oz (137.8 kg)  05/23/23 (!) 307 lb 12.8 oz (139.6 kg)    Physical Exam  Results for orders placed or performed in visit on 07/12/23  ECHOCARDIOGRAM COMPLETE   Collection Time: 07/12/23  9:23 AM  Result Value Ref Range   AR max vel 3.68 cm2   AV Peak grad 6.8 mmHg   Ao pk vel 1.30 m/s   S' Lateral 3.10 cm   Area-P 1/2 4.17 cm2   AV Area VTI 3.64 cm2   AV Mean grad 4.0 mmHg   AV Area mean vel 3.54 cm2   Est EF 60 - 65%    {Labs (Optional):23779}    Assessment & Plan:   Problem List Items Addressed This Visit   None    Assessment and Plan             Follow up plan: No follow-ups on file.

## 2023-08-08 ENCOUNTER — Encounter: Payer: Self-pay | Admitting: Nurse Practitioner

## 2023-08-08 ENCOUNTER — Ambulatory Visit (INDEPENDENT_AMBULATORY_CARE_PROVIDER_SITE_OTHER): Payer: BC Managed Care – PPO | Admitting: Nurse Practitioner

## 2023-08-08 VITALS — BP 134/84 | HR 82 | Temp 97.7°F | Resp 16 | Ht 64.0 in | Wt 302.3 lb

## 2023-08-08 DIAGNOSIS — I10 Essential (primary) hypertension: Secondary | ICD-10-CM | POA: Diagnosis not present

## 2023-08-08 DIAGNOSIS — R058 Other specified cough: Secondary | ICD-10-CM

## 2023-08-08 MED ORDER — LABETALOL HCL 100 MG PO TABS: 100.0000 mg | ORAL_TABLET | Freq: Two times a day (BID) | ORAL | 1 refills | Status: DC

## 2023-08-08 MED ORDER — WEGOVY 1.7 MG/0.75ML ~~LOC~~ SOAJ: 1.7000 mg | SUBCUTANEOUS | 0 refills | Status: DC

## 2023-08-16 DIAGNOSIS — L508 Other urticaria: Secondary | ICD-10-CM | POA: Diagnosis not present

## 2023-08-29 NOTE — Progress Notes (Signed)
 BP 116/78   Pulse 99   Temp 97.8 F (36.6 C)   Resp 18   Ht 5' 4 (1.626 m)   Wt (!) 301 lb 4.8 oz (136.7 kg)   LMP 08/27/2023   SpO2 92%   BMI 51.72 kg/m    Subjective:    Patient ID: Nicole Leonard, female    DOB: [birth year], 28 y.o.   MRN: [id number]  HPI: Nicole Leonard is a 28 y.o. female  Chief Complaint  Patient presents with   Ear Pain    right    Discussed the use of AI scribe software for clinical note transcription with the patient, who gave verbal consent to proceed.  History of Present Illness   The patient, , presents with bilateral ear infections. She initially had a cold last week, which seemed to resolve. However, in the last couple of days, she experienced a 'popping' sensation in both ears, leading to hearing loss. She describes the sensation as hearing only herself. She attempted to manage the symptoms with nasal spray, but it was ineffective. The patient denies any pain when pressure is applied to the head or behind the ears. She has no known allergies except to PCN.       08/08/2023    8:27 AM 07/11/2023    8:08 AM 03/19/2023   11:15 AM  Depression screen PHQ 2/9  Decreased Interest 0 0 0  Down, Depressed, Hopeless 0 0 0  PHQ - 2 Score 0 0 0  Altered sleeping 0    Tired, decreased energy 1    Change in appetite 0    Feeling bad or failure about yourself  0    Trouble concentrating 0    Moving slowly or fidgety/restless 0    Suicidal thoughts 0    PHQ-9 Score 1    Difficult doing work/chores Not difficult at all      Relevant past medical, surgical, family and social history reviewed and updated as indicated. Interim medical history since our last visit reviewed. Allergies and medications reviewed and updated.  Review of Systems  Ten systems reviewed and is negative except as mentioned in HPI      Objective:    BP 116/78   Pulse 99   Temp 97.8 F (36.6 C)   Resp 18   Ht 5' 4 (1.626 m)   Wt (!) 301 lb 4.8 oz (136.7 kg)   LMP  08/27/2023   SpO2 92%   BMI 51.72 kg/m    Wt Readings from Last 3 Encounters:  08/30/23 (!) 301 lb 4.8 oz (136.7 kg)  08/08/23 (!) 302 lb 4.8 oz (137.1 kg)  07/22/23 (!) 303 lb 6.4 oz (137.6 kg)    Physical Exam  Constitutional: Patient appears well-developed and well-nourished. Obese  No distress.  HEENT: head atraumatic, normocephalic, pupils equal and reactive to light, ears TMs erythematous, neck supple, throat within normal limits Cardiovascular: Normal rate, regular rhythm and normal heart sounds.  No murmur heard. No BLE edema. Pulmonary/Chest: Effort normal and breath sounds normal. No respiratory distress. Abdominal: Soft.  There is no tenderness. Psychiatric: Patient has a normal mood and affect. behavior is normal. Judgment and thought content normal.  Results for orders placed or performed in visit on 07/12/23  ECHOCARDIOGRAM COMPLETE   Collection Time: 07/12/23  9:23 AM  Result Value Ref Range   AR max vel 3.68 cm2   AV Peak grad 6.8 mmHg   Ao pk vel 1.30 m/s  S' Lateral 3.10 cm   Area-P 1/2 4.17 cm2   AV Area VTI 3.64 cm2   AV Mean grad 4.0 mmHg   AV Area mean vel 3.54 cm2   Est EF 60 - 65%        Assessment & Plan:   Problem List Items Addressed This Visit   None Visit Diagnoses       Non-recurrent acute suppurative otitis media of both ears without spontaneous rupture of tympanic membranes    -  Primary   Relevant Medications   azithromycin  (ZITHROMAX ) 250 MG tablet        Assessment and Plan    Acute Bilateral Otitis Media Both ears are red and patient reports hearing loss. No fever reported. No pain on palpation of the mastoid process. -Prescribe an antibiotic, azithromycin  -Advise to continue Flonase, an allergy pill, and Mucinex for sinus symptoms. -Encourage adequate hydration and rest. -Request patient to send a follow-up message regarding progress.        Follow up plan: Return if symptoms worsen or fail to improve.

## 2023-08-30 ENCOUNTER — Ambulatory Visit (INDEPENDENT_AMBULATORY_CARE_PROVIDER_SITE_OTHER): Payer: BC Managed Care – PPO | Admitting: Nurse Practitioner

## 2023-08-30 ENCOUNTER — Encounter: Payer: Self-pay | Admitting: Nurse Practitioner

## 2023-08-30 VITALS — BP 116/78 | HR 99 | Temp 97.8°F | Resp 18 | Ht 64.0 in | Wt 301.3 lb

## 2023-08-30 DIAGNOSIS — H66003 Acute suppurative otitis media without spontaneous rupture of ear drum, bilateral: Secondary | ICD-10-CM

## 2023-08-30 MED ORDER — AZITHROMYCIN 250 MG PO TABS: ORAL_TABLET | ORAL | 0 refills | Status: AC

## 2023-09-12 ENCOUNTER — Other Ambulatory Visit: Payer: Self-pay | Admitting: Nurse Practitioner

## 2023-09-12 NOTE — Telephone Encounter (Signed)
Requested Prescriptions  Pending Prescriptions Disp Refills   WEGOVY 1.7 MG/0.75ML SOAJ [Pharmacy Med Name: WNUUVO 1.7 MG/0.75ML Subcutaneous Solution Auto-injector] 4 mL 0    Sig: INJECT 1.7 MG INTO THE SKIN ONCE A WEEK     Endocrinology:  Diabetes - GLP-1 Receptor Agonists - semaglutide Failed - 09/12/2023  3:53 PM      Failed - HBA1C in normal range and within 180 days    Hgb A1c MFr Bld  Date Value Ref Range Status  03/12/2023 5.0 4.8 - 5.6 % Final    Comment:             Prediabetes: 5.7 - 6.4          Diabetes: >6.4          Glycemic control for adults with diabetes: <7.0          Passed - Cr in normal range and within 360 days    Creatinine, Ser  Date Value Ref Range Status  03/12/2023 0.95 0.57 - 1.00 mg/dL Final   Creatinine, Urine  Date Value Ref Range Status  01/30/2022 28 mg/dL Final         Passed - Valid encounter within last 6 months    Recent Outpatient Visits           1 week ago Non-recurrent acute suppurative otitis media of both ears without spontaneous rupture of tympanic membranes   [facility] Berniece Salines, FNP   1 month ago Primary hypertension   [facility] Berniece Salines, FNP   2 months ago Primary hypertension   [facility] Berniece Salines, FNP       Future Appointments             In 5 months Zane Herald, Rudolpho Sevin, FNP [facility], [facility]

## 2023-09-16 ENCOUNTER — Other Ambulatory Visit: Payer: Self-pay | Admitting: Nurse Practitioner

## 2023-09-16 DIAGNOSIS — R058 Other specified cough: Secondary | ICD-10-CM

## 2023-09-16 DIAGNOSIS — K219 Gastro-esophageal reflux disease without esophagitis: Secondary | ICD-10-CM

## 2023-09-16 MED ORDER — OMEPRAZOLE 20 MG PO CPDR: 20.0000 mg | DELAYED_RELEASE_CAPSULE | Freq: Every day | ORAL | 1 refills | Status: DC

## 2023-09-23 ENCOUNTER — Institutional Professional Consult (permissible substitution): Payer: BC Managed Care – PPO | Admitting: Internal Medicine

## 2023-11-22 DIAGNOSIS — L508 Other urticaria: Secondary | ICD-10-CM | POA: Diagnosis not present

## 2023-12-19 ENCOUNTER — Ambulatory Visit (INDEPENDENT_AMBULATORY_CARE_PROVIDER_SITE_OTHER): Admitting: Nurse Practitioner

## 2023-12-19 ENCOUNTER — Encounter: Payer: Self-pay | Admitting: Family Medicine

## 2023-12-19 VITALS — BP 128/70 | HR 101 | Resp 16 | Ht 64.0 in | Wt 280.0 lb

## 2023-12-19 DIAGNOSIS — Z3201 Encounter for pregnancy test, result positive: Secondary | ICD-10-CM | POA: Diagnosis not present

## 2023-12-19 LAB — POCT URINE PREGNANCY: Preg Test, Ur: POSITIVE — AB

## 2023-12-19 MED ORDER — DOXYLAMINE-PYRIDOXINE 10-10 MG PO TBEC: DELAYED_RELEASE_TABLET | ORAL | 2 refills | Status: DC

## 2023-12-19 NOTE — Patient Instructions (Signed)
 Results for orders placed or performed in visit on 12/19/23  POCT urine pregnancy   Collection Time: 12/19/23  9:27 AM  Result Value Ref Range   Preg Test, Ur Positive (A) Negative    Last menstrual period LMP 11/19/2023 Estimated gestational age EGA [redacted]w[redacted]d Estimated due date EDD 08/25/2024

## 2023-12-19 NOTE — Progress Notes (Signed)
 Patient ID: Nicole Leonard, female    DOB: 06-28-1996, 28 y.o.   MRN: [id number]  PCP: Quinton Buckler, FNP  Chief Complaint  Patient presents with   Positive Pregnancy Test    2 positive home preg test from yesterday    Subjective:   Nicole Leonard is a 28 y.o. female, presents to clinic with CC of the following:  HPI  Here for OV and medical confirmation of pregnancy Patient's last menstrual period was 11/19/2023. Two home preg tests x 2 Here in office POC preg also positive Results for orders placed or performed in visit on 12/19/23  POCT urine pregnancy   Collection Time: 12/19/23  9:27 AM  Result Value Ref Range   Preg Test, Ur Positive (A) Negative   G2P0 - recent fetal demise at 31 weeks  No cramping/pain bleeding Asked about her meds - reviewed them each today for safety in pregnancy     Patient Active Problem List   Diagnosis Date Noted   Mixed hyperlipidemia 07/11/2023   Hypertension 148/92 on 05/10/22 05/10/2022   Morbid obesity (HCC) 301 lbs 05/10/2022   Smoker 1/2-3/4 ppd 05/10/2022   Marijuana use 05/10/2022   IUFD at 31.5  wks 02/02/22 02/01/2022      Current Outpatient Medications:    cetirizine (ZYRTEC) 10 MG tablet, Take 10 mg by mouth daily., Disp: , Rfl:    labetalol  (NORMODYNE ) 100 MG tablet, Take 1 tablet (100 mg total) by mouth 2 (two) times daily., Disp: 180 tablet, Rfl: 1   omeprazole  (PRILOSEC) 20 MG capsule, Take 1 capsule (20 mg total) by mouth daily., Disp: 90 capsule, Rfl: 1   WEGOVY  1.7 MG/0.75ML SOAJ, INJECT 1.7 MG INTO THE SKIN ONCE A WEEK, Disp: 4 mL, Rfl: 0   Allergies  Allergen Reactions   Lavender Oil Hives   Penicillins Other (See Comments)    Unknown, was as child     Social History   Tobacco Use   Smoking status: Every Day    Current packs/day: 0.00    Average packs/day: 0.5 packs/day for 10.0 years (5.0 ttl pk-yrs)    Types: Cigarettes    Start date: 08/05/2011    Last attempt to quit: 08/04/2021     Years since quitting: 2.3   Smokeless tobacco: Never  Vaping Use   Vaping status: Never Used  Substance Use Topics   Alcohol use: Yes    Alcohol/week: 5.0 standard drinks of alcohol    Types: 5 Shots of liquor per week    Comment: last use 02/20/22 3x/yr   Drug use: Yes    Types: Marijuana    Comment: 7 days a week      Chart Review Today: I personally reviewed active problem list, medication list, allergies, family history, social history, health maintenance, notes from last encounter, lab results, imaging with the patient/caregiver today.   Review of Systems  Constitutional: Negative.   HENT: Negative.    Eyes: Negative.   Respiratory: Negative.    Cardiovascular: Negative.   Gastrointestinal:  Positive for nausea. Negative for abdominal pain, anal bleeding, constipation and vomiting.  Endocrine: Negative.   Genitourinary: Negative.   Musculoskeletal: Negative.   Skin: Negative.   Allergic/Immunologic: Negative.   Neurological: Negative.   Hematological: Negative.   Psychiatric/Behavioral: Negative.    All other systems reviewed and are negative.      Objective:   Vitals:   12/19/23 0917  BP: 128/70  Pulse: (!) 101  Resp: 16  SpO2: 98%  Weight: 280 lb (127 kg)  Height: 5\' 4"  (1.626 m)    Body mass index is 48.06 kg/m.  Physical Exam Vitals and nursing note reviewed.  Constitutional:      General: She is not in acute distress.    Appearance: She is obese. She is not ill-appearing, toxic-appearing or diaphoretic.  HENT:     Head: Normocephalic and atraumatic.     Right Ear: External ear normal.     Left Ear: External ear normal.  Cardiovascular:     Rate and Rhythm: Normal rate.  Pulmonary:     Effort: No respiratory distress.  Neurological:     Mental Status: She is alert.     Gait: Gait normal.  Psychiatric:        Mood and Affect: Mood normal.        Behavior: Behavior normal.      Results for orders placed or performed in visit on 12/19/23   POCT urine pregnancy   Collection Time: 12/19/23  9:27 AM  Result Value Ref Range   Preg Test, Ur Positive (A) Negative       Assessment & Plan:   1. Positive pregnancy test (Primary) Positive Poc test here Reviewed her sx - only nausea - can try diclegis  Advised stop wegovy , can try diclegis , avoid NSAIDS/ASA, start prenatal F/up OBGYN - asked her to let us  know if she needs referral to new clinic - her OB left the practice - POCT urine pregnancy - Ambulatory referral to Obstetrics / Gynecology - Doxylamine -Pyridoxine  10-10 MG TBEC; Take 2 tabs poqhs x 2 d, if nausea not well controlled then take 1 tab poqam and 2 tab poqam x 2 d, then if still nauseous take 1 tab poqam, 1 tab po midday, 2 tab poqhs  Dispense: 60 tablet; Refill: 2   Reviewed meds she is on and recommended stopping wegovy   1st trimester info and EDD/EGA info provided to her today as well All questions asked and answered      Adeline Hone, PA-C 12/19/23 9:31 AM

## 2023-12-26 ENCOUNTER — Ambulatory Visit: Payer: Self-pay | Admitting: *Deleted

## 2023-12-26 NOTE — Telephone Encounter (Signed)
 Copied from CRM [id number]. Topic: Clinical - Red Word Triage >> Dec 26, 2023 12:47 PM Fredrica W wrote: Red Word that prompted transfer to Nurse Triage: panic attacks and dizziness - patient is pregnant Reason for Disposition  Panic attacks are increasing in frequency    2 months pregnant  Answer Assessment - Initial Assessment Questions 1. CONCERN: "Did anything happen that prompted you to call today?"      I'm pregnant maybe 2 months along.   I'm dizzy and having panic attacks.   Every other day I'm having chest heaviness and trouble breathing.  I've had anxiety before.   This is my second pregnancy.   I'm very high risk.  I'm trying to get out of work.   Yesterday my BP is normal.   I don't know why I'm having so much anxiety. 2. ANXIETY SYMPTOMS: "Can you describe how you (your loved one; patient) have been feeling?" (e.g., tense, restless, panicky, anxious, keyed up, overwhelmed, sense of impending doom).      Trouble breathing and chest tightness.    3. ONSET: "How long have you been feeling this way?" (e.g., hours, days, weeks)     First time Sunday.   I was out eating with my husband.   I got dizzy and off balance. 4. SEVERITY: "How would you rate the level of anxiety?" (e.g., 0 - 10; or mild, moderate, severe).     Severe 5. FUNCTIONAL IMPAIRMENT: "How have these feelings affected your ability to do daily activities?" "Have you had more difficulty than usual doing your normal daily activities?" (e.g., getting better, same, worse; self-care, school, work, interactions)     Yes 6. HISTORY: "Have you felt this way before?" "Have you ever been diagnosed with an anxiety problem in the past?" (e.g., generalized anxiety disorder, panic attacks, PTSD). If Yes, ask: "How was this problem treated?" (e.g., medicines, counseling, etc.)     Yes 7. RISK OF HARM - SUICIDAL IDEATION: "Do you ever have thoughts of hurting or killing yourself?" If Yes, ask:  "Do you have these feelings now?" "Do you have a  plan on how you would do this?"     No suicidal thoughts.     I'm more concerned about my child.   I don't want to go through what I did last time. 8. TREATMENT:  "What has been done so far to treat this anxiety?" (e.g., medicines, relaxation strategies). "What has helped?"     I'm not on medication at this point 9. TREATMENT - THERAPIST: "Do you have a counselor or therapist? Name?"     No 10. POTENTIAL TRIGGERS: "Do you drink caffeinated beverages (e.g., coffee, colas, teas), and how much daily?" "Do you drink alcohol or use any drugs?" "Have you started any new medicines recently?"       pregnancy 11. PATIENT SUPPORT: "Who is with you now?" "Who do you live with?" "Do you have family or friends who you can talk to?"        My sister. 12. OTHER SYMPTOMS: "Do you have any other symptoms?" (e.g., feeling depressed, trouble concentrating, trouble sleeping, trouble breathing, palpitations or fast heartbeat, chest pain, sweating, nausea, or diarrhea)       Panic attacks. During pregnancy 13. PREGNANCY: "Is there any chance you are pregnant?" "When was your last menstrual period?"       Yes 2 months along now.  Protocols used: Anxiety and Panic Attack-A-AH  Chief Complaint: Panic attacks, anxiety with being 2 months pregnant  Symptoms: Trouble breathing and chest heaviness.     I had this with my first pregnancy too.   Terrible anxiety. Frequency: Every other day I'm having anxiety attacks. Pertinent Negatives: Patient denies being on medications for anxiety Disposition: [] ED /[] Urgent Care (no appt availability in office) / [x] Appointment(In office/virtual)/ []  [facility]/ [] Home Care/ [] Refused Recommended Disposition /[] [facility]/ []  Follow-up with PCP Additional Notes: Appt made with Donny Gall, FNP for 12/27/2023.

## 2023-12-27 ENCOUNTER — Encounter: Payer: Self-pay | Admitting: Nurse Practitioner

## 2023-12-27 ENCOUNTER — Ambulatory Visit (INDEPENDENT_AMBULATORY_CARE_PROVIDER_SITE_OTHER): Admitting: Nurse Practitioner

## 2023-12-27 VITALS — BP 126/72 | HR 116 | Temp 97.6°F | Resp 18 | Ht 64.0 in | Wt 276.9 lb

## 2023-12-27 DIAGNOSIS — Z3201 Encounter for pregnancy test, result positive: Secondary | ICD-10-CM | POA: Diagnosis not present

## 2023-12-27 DIAGNOSIS — F419 Anxiety disorder, unspecified: Secondary | ICD-10-CM | POA: Diagnosis not present

## 2023-12-27 DIAGNOSIS — R110 Nausea: Secondary | ICD-10-CM

## 2023-12-27 DIAGNOSIS — Z1159 Encounter for screening for other viral diseases: Secondary | ICD-10-CM | POA: Diagnosis not present

## 2023-12-27 LAB — COMPREHENSIVE METABOLIC PANEL WITH GFR
AG Ratio: 1.7 (calc) (ref 1.0–2.5)
ALT: 16 U/L (ref 6–29)
AST: 12 U/L (ref 10–30)
Albumin: 4 g/dL (ref 3.6–5.1)
Alkaline phosphatase (APISO): 61 U/L (ref 31–125)
BUN: 11 mg/dL (ref 7–25)
CO2: 24 mmol/L (ref 20–32)
Calcium: 9.3 mg/dL (ref 8.6–10.2)
Chloride: 105 mmol/L (ref 98–110)
Creat: 0.72 mg/dL (ref 0.50–0.96)
Globulin: 2.4 g/dL (ref 1.9–3.7)
Glucose, Bld: 86 mg/dL (ref 65–99)
Potassium: 4 mmol/L (ref 3.5–5.3)
Sodium: 136 mmol/L (ref 135–146)
Total Bilirubin: 0.7 mg/dL (ref 0.2–1.2)
Total Protein: 6.4 g/dL (ref 6.1–8.1)
eGFR: 117 mL/min/{1.73_m2} (ref >=60)

## 2023-12-27 LAB — CBC WITH DIFFERENTIAL/PLATELET
Absolute Lymphocytes: 3091 {cells}/uL (ref 850–3900)
Absolute Monocytes: 595 {cells}/uL (ref 200–950)
Basophils Absolute: 38 {cells}/uL (ref 0–200)
Basophils Relative: 0.4 %
Eosinophils Absolute: 221 {cells}/uL (ref 15–500)
Eosinophils Relative: 2.3 %
HCT: 37.5 % (ref 35.0–45.0)
Hemoglobin: 12.7 g/dL (ref 11.7–15.5)
MCH: 29.8 pg (ref 27.0–33.0)
MCHC: 33.9 g/dL (ref 32.0–36.0)
MCV: 88 fL (ref 80.0–100.0)
MPV: 8.8 fL (ref 7.5–12.5)
Monocytes Relative: 6.2 %
Neutro Abs: 5654 {cells}/uL (ref 1500–7800)
Neutrophils Relative %: 58.9 %
Platelets: 405 10*3/uL — ABNORMAL HIGH (ref 140–400)
RBC: 4.26 10*6/uL (ref 3.80–5.10)
RDW: 12.6 % (ref 11.0–15.0)
Total Lymphocyte: 32.2 %
WBC: 9.6 10*3/uL (ref 3.8–10.8)

## 2023-12-27 LAB — POCT URINALYSIS DIPSTICK
Bilirubin, UA: NEGATIVE
Blood, UA: NEGATIVE
Glucose, UA: NEGATIVE
Ketones, UA: NEGATIVE
Leukocytes, UA: NEGATIVE
Nitrite, UA: NEGATIVE
Odor: NORMAL
Protein, UA: POSITIVE — AB
Spec Grav, UA: 1.025 (ref 1.010–1.025)
Urobilinogen, UA: 0.2 U/dL
pH, UA: 8 (ref 5.0–8.0)

## 2023-12-27 LAB — HCG, QUANTITATIVE, PREGNANCY: HCG, Total, QN: 4345 m[IU]/mL

## 2023-12-27 LAB — HEPATITIS C ANTIBODY: Hepatitis C Ab: NONREACTIVE

## 2023-12-27 LAB — TSH: TSH: 2.33 m[IU]/L

## 2023-12-27 LAB — HIV ANTIBODY (ROUTINE TESTING W REFLEX): HIV 1&2 Ab, 4th Generation: NONREACTIVE

## 2023-12-27 MED ORDER — SERTRALINE HCL 25 MG PO TABS: 25.0000 mg | ORAL_TABLET | Freq: Every day | ORAL | 0 refills | Status: DC

## 2023-12-27 NOTE — Progress Notes (Signed)
 BP 126/72   Pulse (!) 116   Temp 97.6 F (36.4 C)   Resp 18   Ht 5\' 4"  (1.626 m)   Wt 276 lb 14.4 oz (125.6 kg)   LMP 11/19/2023   SpO2 98%   BMI 47.53 kg/m    Subjective:    Patient ID: Nicole Leonard, female    DOB: 1996/06/22, 28 y.o.   MRN: [id number]  HPI: Nicole Leonard is a 28 y.o. female  Chief Complaint  Patient presents with   positive pregnancy   Dizziness   panic attacks    Discussed the use of AI scribe software for clinical note transcription with the patient, who gave verbal consent to proceed.  History of Present Illness Nicole Leonard is a 28 year old female who presents with panic attacks and dizziness during pregnancy.  She experiences frequent panic attacks characterized by difficulty breathing and chest tightness. These episodes are often accompanied by dizziness and a sense of imbalance, exacerbating each other and leading to significant distress. Anxiety occurs almost daily, sometimes waking her up unable to breathe. She has not been on any medication for anxiety in the past.  She is approximately two months pregnant, with her last menstrual period on November 19, 2023. She has a history of significant leg swelling and tightness during a previous pregnancy, exacerbated by her work conditions. 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  She has been attempting to quit smoking, which she believes is contributing to her increased anxiety levels. She has reduced her smoking significantly but finds it challenging to stop completely.  She reports occasional stomach pain and constipation, which she manages by increasing fiber intake and staying hydrated. No burning sensation during urination, but she is aware of the increased risk of urinary tract infections during pregnancy.         12/27/2023    8:37 AM 08/08/2023    8:27 AM 07/11/2023    8:08 AM   Depression screen PHQ 2/9  Decreased Interest 2 0 0  Down, Depressed, Hopeless 0 0 0  PHQ - 2 Score 2 0 0  Altered sleeping 1 0   Tired, decreased energy 2 1   Change in appetite 1 0   Feeling bad or failure about yourself  1 0   Trouble concentrating 1 0   Moving slowly or fidgety/restless 1 0   Suicidal thoughts 0 0   PHQ-9 Score 9 1   Difficult doing work/chores Somewhat difficult Not difficult at all     Relevant past medical, surgical, family and social history reviewed and updated as indicated. Interim medical history since our last visit reviewed. Allergies and medications reviewed and updated.  Review of Systems  Ten systems reviewed and is negative except as mentioned in HPI      Objective:      BP 126/72   Pulse (!) 116   Temp 97.6 F (36.4 C)   Resp 18   Ht 5\' 4"  (1.626 m)   Wt 276 lb 14.4 oz (125.6 kg)   LMP 11/19/2023   SpO2 98%   BMI 47.53 kg/m    Wt Readings from Last 3 Encounters:  12/27/23 276 lb 14.4 oz (125.6 kg)  12/19/23 280 lb (127 kg)  08/30/23 (!) 301 lb 4.8 oz (136.7 kg)    Physical Exam Vitals reviewed.  Constitutional:      Appearance: Normal  appearance.  HENT:     Head: Normocephalic.  Cardiovascular:     Rate and Rhythm: Normal rate and regular rhythm.  Pulmonary:     Effort: Pulmonary effort is normal.     Breath sounds: Normal breath sounds.  Musculoskeletal:        General: Normal range of motion.  Skin:    General: Skin is warm and dry.  Neurological:     General: No focal deficit present.     Mental Status: She is alert and oriented to person, place, and time. Mental status is at baseline.  Psychiatric:        Mood and Affect: Mood normal.        Behavior: Behavior normal.        Thought Content: Thought content normal.        Judgment: Judgment normal.       Results for orders placed or performed in visit on 12/27/23  POCT urinalysis dipstick   Collection Time: 12/27/23  9:07 AM  Result Value Ref Range    Color, UA yellow    Clarity, UA clear    Glucose, UA Negative Negative   Bilirubin, UA neg    Ketones, UA neg    Spec Grav, UA 1.025 1.010 - 1.025   Blood, UA neg    pH, UA 8.0 5.0 - 8.0   Protein, UA Positive (A) Negative   Urobilinogen, UA 0.2 0.2 or 1.0 E.U./dL   Nitrite, UA neg    Leukocytes, UA Negative Negative   Appearance clear    Odor normal           Assessment & Plan:   Problem List Items Addressed This Visit   None Visit Diagnoses       Positive pregnancy test    -  Primary   Relevant Orders   CBC with Differential/Platelet   Comprehensive metabolic panel with GFR   TSH   hCG, quantitative, pregnancy   HIV Antibody (routine testing w rflx)   Hepatitis C antibody     Anxiety       Relevant Medications   sertraline (ZOLOFT) 25 MG tablet     Nausea       Relevant Orders   POCT urinalysis dipstick (Completed)        Assessment and Plan Assessment & Plan Pregnancy Approximately two months pregnant with last menstrual period on November 19, 2023. Reports mild abdominal pain and work-related stress. Discussed potential work accommodations due to pregnancy-related symptoms and the need for FMLA paperwork for anxiety-related accommodations. Complete work leave to be managed by OB GYN. Emphasized the importance of screening for urinary tract infections due to increased risk during pregnancy. 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 Symptoms occur almost daily. No prior anxiety medication. Discussed Zoloft (sertraline) as a treatment option due to its safety profile in pregnancy and lower side effect  profile. Explained the goal is to reduce anxiety to a manageable level without eliminating all emotions. Discussed potential for Zoloft to aid in smoking cessation. Informed that significant improvement may take up to four weeks. - Prescribe Zoloft (sertraline) - Schedule follow-up in four weeks to assess response to Zoloft  Follow up plan: Return in about 4 weeks (around 01/24/2024) for follow up.

## 2023-12-29 ENCOUNTER — Other Ambulatory Visit: Payer: Self-pay

## 2023-12-29 ENCOUNTER — Encounter: Payer: Self-pay | Admitting: Emergency Medicine

## 2023-12-29 ENCOUNTER — Emergency Department
Admission: EM | Admit: 2023-12-29 | Discharge: 2023-12-29 | Disposition: A | Discharge status: Home / Self Care | Attending: Emergency Medicine | Admitting: Emergency Medicine

## 2023-12-29 ENCOUNTER — Emergency Department

## 2023-12-29 DIAGNOSIS — O26891 Other specified pregnancy related conditions, first trimester: Secondary | ICD-10-CM | POA: Diagnosis not present

## 2023-12-29 DIAGNOSIS — Z3A01 Less than 8 weeks gestation of pregnancy: Secondary | ICD-10-CM | POA: Insufficient documentation

## 2023-12-29 DIAGNOSIS — R109 Unspecified abdominal pain: Secondary | ICD-10-CM | POA: Insufficient documentation

## 2023-12-29 DIAGNOSIS — R1032 Left lower quadrant pain: Secondary | ICD-10-CM | POA: Diagnosis not present

## 2023-12-29 DIAGNOSIS — O26899 Other specified pregnancy related conditions, unspecified trimester: Secondary | ICD-10-CM

## 2023-12-29 LAB — HCG, QUANTITATIVE, PREGNANCY: hCG, Beta Chain, Quant, S: 9084 m[IU]/mL — ABNORMAL HIGH (ref <5)

## 2023-12-29 LAB — CBC WITH DIFFERENTIAL/PLATELET
Abs Immature Granulocytes: 0.03 10*3/uL (ref 0.00–0.07)
Basophils Absolute: 0 10*3/uL (ref 0.0–0.1)
Basophils Relative: 0 %
Eosinophils Absolute: 0.3 10*3/uL (ref 0.0–0.5)
Eosinophils Relative: 2 %
HCT: 38.2 % (ref 36.0–46.0)
Hemoglobin: 13 g/dL (ref 12.0–15.0)
Immature Granulocytes: 0 %
Lymphocytes Relative: 36 %
Lymphs Abs: 4.1 10*3/uL — ABNORMAL HIGH (ref 0.7–4.0)
MCH: 30.6 pg (ref 26.0–34.0)
MCHC: 34 g/dL (ref 30.0–36.0)
MCV: 89.9 fL (ref 80.0–100.0)
Monocytes Absolute: 0.7 10*3/uL (ref 0.1–1.0)
Monocytes Relative: 6 %
Neutro Abs: 6.1 10*3/uL (ref 1.7–7.7)
Neutrophils Relative %: 56 %
Platelets: 364 10*3/uL (ref 150–400)
RBC: 4.25 MIL/uL (ref 3.87–5.11)
RDW: 13.1 % (ref 11.5–15.5)
WBC: 11.2 10*3/uL — ABNORMAL HIGH (ref 4.0–10.5)
nRBC: 0 % (ref 0.0–0.2)

## 2023-12-29 LAB — URINALYSIS, ROUTINE W REFLEX MICROSCOPIC
Bacteria, UA: NONE SEEN
Bilirubin Urine: NEGATIVE
Glucose, UA: NEGATIVE mg/dL
Hgb urine dipstick: NEGATIVE
Ketones, ur: NEGATIVE mg/dL
Leukocytes,Ua: NEGATIVE
Nitrite: NEGATIVE
Protein, ur: 30 mg/dL — AB
Specific Gravity, Urine: 1.03 (ref 1.005–1.030)
pH: 6 (ref 5.0–8.0)

## 2023-12-29 LAB — COMPREHENSIVE METABOLIC PANEL WITH GFR
ALT: 19 U/L (ref 0–44)
AST: 14 U/L — ABNORMAL LOW (ref 15–41)
Albumin: 3.7 g/dL (ref 3.5–5.0)
Alkaline Phosphatase: 51 U/L (ref 38–126)
Anion gap: 6 (ref 5–15)
BUN: 7 mg/dL (ref 6–20)
CO2: 21 mmol/L — ABNORMAL LOW (ref 22–32)
Calcium: 9.1 mg/dL (ref 8.9–10.3)
Chloride: 107 mmol/L (ref 98–111)
Creatinine, Ser: 0.78 mg/dL (ref 0.44–1.00)
GFR, Estimated: >60 mL/min (ref >60)
Glucose, Bld: 93 mg/dL (ref 70–99)
Potassium: 4 mmol/L (ref 3.5–5.1)
Sodium: 134 mmol/L — ABNORMAL LOW (ref 135–145)
Total Bilirubin: 1 mg/dL (ref 0.0–1.2)
Total Protein: 7.1 g/dL (ref 6.5–8.1)

## 2023-12-29 LAB — ABO/RH: ABO/RH(D): O POS

## 2023-12-29 NOTE — ED Triage Notes (Signed)
 Pt is roughly [redacted]wks pregnant - in with low abdominal cramping that began a few days ago on the R side and has now migrated to low middle abdomen. Has appt w/OB for transvaginal US  on 6/23. Pt denies any vaginal bleeding or discharge, reports nausea

## 2023-12-29 NOTE — ED Provider Notes (Signed)
 [facility] Provider Note    Event Date/Time   First MD Initiated Contact with Patient 12/29/23 0301     (approximate)   History   Abdominal Pain and [redacted] Weeks Pregnant   HPI Nicole Leonard is a 28 y.o. female G2, P0 very early in her pregnancy (possibly as much is 5 weeks) who presents for evaluation of abdominal pain and cramping.  It is intermittent and mostly a cramping sensation but she states that she had a stillbirth pregnancy about 2 years ago and she just wants to make sure things okay.  She has not had any pain when she urinates and no vaginal bleeding.  No recent fevers, no abdominal trauma.  She has an appoint with [facility] in about a month for an ultrasound.     Physical Exam   Triage Vital Signs: ED Triage Vitals [12/29/23 0206]  Encounter Vitals Group     BP 130/85     Systolic BP Percentile      Diastolic BP Percentile      Pulse Rate 86     Resp 18     Temp 97.8 F (36.6 C)     Temp Source Oral     SpO2 98 %     Weight 125.6 kg (276 lb 14.4 oz)     Height      Head Circumference      Peak Flow      Pain Score 5     Pain Loc      Pain Education      Exclude from Growth Chart     Most recent vital signs: Vitals:   12/29/23 0206  BP: 130/85  Pulse: 86  Resp: 18  Temp: 97.8 F (36.6 C)  SpO2: 98%    General: Awake, no distress.  CV:  Good peripheral perfusion.  Resp:  Normal effort. Speaking easily and comfortably, no accessory muscle usage nor intercostal retractions.   Abd:  Obese.  Abdomen is soft and nontender to palpation. Other:  Deferred GU exam.   ED Results / Procedures / Treatments   Labs (all labs ordered are listed, but only abnormal results are displayed) Labs Reviewed  HCG, QUANTITATIVE, PREGNANCY - Abnormal; Notable for the following components:      Result Value   hCG, Beta Chain, Quant, S 9,084 (*)    All other components within normal limits  CBC WITH DIFFERENTIAL/PLATELET - Abnormal;  Notable for the following components:   WBC 11.2 (*)    Lymphs Abs 4.1 (*)    All other components within normal limits  URINALYSIS, ROUTINE W REFLEX MICROSCOPIC - Abnormal; Notable for the following components:   Color, Urine YELLOW (*)    APPearance HAZY (*)    Protein, ur 30 (*)    All other components within normal limits  COMPREHENSIVE METABOLIC PANEL WITH GFR - Abnormal; Notable for the following components:   Sodium 134 (*)    CO2 21 (*)    AST 14 (*)    All other components within normal limits  ABO/RH     RADIOLOGY See ED course for details   PROCEDURES:  Critical Care performed: No  Procedures    IMPRESSION / MDM / ASSESSMENT AND PLAN / ED COURSE  I reviewed the triage vital signs and the nursing notes.  Differential diagnosis includes, but is not limited to, anxiety about pregnancy, pain associated with early pregnancy, ectopic pregnancy, normal intrauterine pregnancy, ovarian pain/torsion.  Patient's presentation is most consistent with acute presentation with potential threat to life or bodily function.  Labs/studies ordered: CBC with differential, CMP, ABO Rh, hCG, urinalysis, pelvic ultrasound  Interventions/Medications given:  Medications - No data to display  (Note:  hospital course my include additional interventions and/or labs/studies not listed above.)   Patient is not having vaginal bleeding and is hemodynamically stable.  I think she is experiencing some pain and cramping associated with early pregnancy and is understandably concerned based on her stillbirth 2 years ago.  I explained to her that it is very unlikely that at this early stage of pregnancy (based on her date of last menstrual period as well as her hCG of only 9000) that we would be able to provide reassurance that that "everything is okay".  However we will proceed with the ultrasound with the understanding that it may be too early to see anything  definitive.  Her urinalysis is reassuring with no need for antibiotics.  She understands and agrees with the plan.     Clinical Course as of 12/29/23 7425  Nicole Leonard Dec 29, 2023  0505 Patient is a ReSound is done and she wants to leave right now because her husband needs to get work.  I think that is reasonable given no indication of an emergent medical condition.  I counseled her to check MyChart for the results and I will also watch for any emergent findings on the report (such as an ectopic pregnancy).  She is going to follow-up as scheduled with [facility].  I gave my usual and customary return precautions. [CF]  0507   The patient's medical screening exam is reassuring with no indication of an emergent medical condition requiring hospitalization or additional evaluation at this point.  The patient is safe and appropriate for discharge and outpatient follow up. [CF]    Clinical Course User Index [CF] Lynnda Sas, MD     FINAL CLINICAL IMPRESSION(S) / ED DIAGNOSES   Final diagnoses:  Abdominal pain in early pregnancy     Rx / DC Orders   ED Discharge Orders     None        Note:  This document was prepared using Dragon voice recognition software and may include unintentional dictation errors.   Lynnda Sas, MD 12/29/23 [phone]

## 2023-12-29 NOTE — Discharge Instructions (Addendum)
 As we discussed, it is likely too early for your ultrasound to give you any definitive answers.  Since you had to leave before the results are ready, please be sure to check MyChart for the results.  Follow-up as scheduled with [facility], or call them to see if they will schedule you for an earlier appointment.    Return to the emergency department if you develop new or worsening symptoms that concern you.

## 2023-12-30 ENCOUNTER — Encounter: Payer: Self-pay | Admitting: Nurse Practitioner

## 2024-01-10 ENCOUNTER — Telehealth (INDEPENDENT_AMBULATORY_CARE_PROVIDER_SITE_OTHER)

## 2024-01-10 DIAGNOSIS — Z3480 Encounter for supervision of other normal pregnancy, unspecified trimester: Secondary | ICD-10-CM | POA: Insufficient documentation

## 2024-01-10 DIAGNOSIS — O0990 Supervision of high risk pregnancy, unspecified, unspecified trimester: Secondary | ICD-10-CM | POA: Insufficient documentation

## 2024-01-10 DIAGNOSIS — Z8759 Personal history of other complications of pregnancy, childbirth and the puerperium: Secondary | ICD-10-CM | POA: Insufficient documentation

## 2024-01-10 DIAGNOSIS — Z3689 Encounter for other specified antenatal screening: Secondary | ICD-10-CM

## 2024-01-10 NOTE — Patient Instructions (Signed)
 First Trimester of Pregnancy  The first trimester of pregnancy starts on the first day of your last monthly period until the end of week 13. This is months 1 through 3 of pregnancy. A week after a sperm fertilizes an egg, the egg will implant into the wall of the uterus and begin to develop into a baby. Body changes during your first trimester Your body goes through many changes during pregnancy. The changes usually return to normal after your baby is born. Physical changes Your breasts may grow larger and may hurt. The area around your nipples may get darker. Your periods will stop. Your hair and nails may grow faster. You may pee more often. Health changes You may tire easily. Your gums may bleed and may be sensitive when you brush and floss. You may not feel hungry. You may have heartburn. You may throw up or feel like you may throw up. You may want to eat some foods, but not others. You may have headaches. You may have trouble pooping (constipation). Other changes Your emotions may change from day to day. You may have more dreams. Follow these instructions at home: Medicines Talk to your health care provider if you're taking medicines. Ask if the medicines are safe to take during pregnancy. Your provider may change the medicines that you take. Do not take any medicines unless told to by your provider. Take a prenatal vitamin that has at least 600 micrograms (mcg) of folic acid. Do not use herbal medicines, illegal substances, or medicines that are not approved by your provider. Eating and drinking While you're pregnant your body needs extra food for your growing baby. Talk with your provider about what to eat while pregnant. Activity Most women are able to exercise during pregnancy. Exercises may need to change as your pregnancy goes on. Talk to your provider about your activities and exercise routines. Relieving pain and discomfort Wear a good, supportive bra if your breasts  hurt. Rest with your legs raised if you have leg cramps or low back pain. Safety Wear your seatbelt at all times when you're in a car. Talk to your provider if someone hits you, hurts you, or yells at you. Talk with your provider if you're feeling sad or have thoughts of hurting yourself. Lifestyle Certain things can be harmful while you're pregnant. Follow these rules: Do not use hot tubs, steam rooms, or saunas. Do not douche. Do not use tampons or scented pads. Do not drink alcohol,smoke, vape, or use products with nicotine or tobacco in them. If you need help quitting, talk with your provider. Avoid cat litter boxes and soil used by cats. These things carry germs that can cause harm to your pregnancy and your baby. General instructions Keep all follow-up visits. It helps you and your unborn baby stay as healthy as possible. Write down your questions. Take them to your visits. Your provider will: Talk with you about your overall health. Give you advice or refer you to specialists who can help with different needs, including: Prenatal education classes. Mental health and counseling. Foods and healthy eating. Ask for help if you need help with food. Call your dentist and ask to be seen. Brush your teeth with a soft toothbrush. Floss gently. Where to find more information American Pregnancy Association: americanpregnancy.org [employer]: acog.org Office on [employer] Health: [employer] Contact a health care provider if: You feel dizzy, faint, or have a fever. You vomit or have watery poop (diarrhea) for 2  days or more. You have abnormal discharge or bleeding from your vagina. You have pain when you pee or your pee smells bad. You have cramps, pain, or pressure in your belly area. Get help right away if: You have trouble breathing or chest pain. You have any kind of injury, such as from a fall or a car crash. These symptoms may be an  emergency. Get help right away. Call 911. Do not wait to see if the symptoms will go away. Do not drive yourself to the hospital. This information is not intended to replace advice given to you by your health care provider. Make sure you discuss any questions you have with your health care provider. Document Revised: 05/09/2023 Document Reviewed: 12/07/2022 Elsevier Patient Education  2024 Elsevier Inc.   Common Medications Safe in Pregnancy  Acne:      Constipation:  Benzoyl Peroxide     Colace  Clindamycin      Dulcolax Suppository  Topica Erythromycin     Fibercon  Salicylic Acid      Metamucil         Miralax AVOID:        Senakot   Accutane    Cough:  Retin-A       Cough Drops  Tetracycline      Phenergan w/ Codeine if Rx  Minocycline      Robitussin (Plain & DM)  Antibiotics:     Crabs/Lice:  Ceclor       RID  Cephalosporins    AVOID:  E-Mycins      Kwell  Keflex  Macrobid/Macrodantin   Diarrhea:  Penicillin      Kao-Pectate  Zithromax      Imodium AD         PUSH FLUIDS AVOID:       Cipro     Fever:  Tetracycline      Tylenol (Regular or Extra  Minocycline       Strength)  Levaquin      Extra Strength-Do not          Exceed 8 tabs/24 hrs Caffeine:        200mg /day (equiv. To 1 cup of coffee or  approx. 3 12 oz sodas)         Gas: Cold/Hayfever:       Gas-X  Benadryl      Mylicon  Claritin       Phazyme  **Claritin-D        Chlor-Trimeton    Headaches:  Dimetapp      ASA-Free Excedrin  Drixoral-Non-Drowsy     Cold Compress  Mucinex (Guaifenasin)     Tylenol (Regular or Extra  Sudafed/Sudafed-12 Hour     Strength)  **Sudafed PE Pseudoephedrine   Tylenol Cold & Sinus     Vicks Vapor Rub  Zyrtec  **AVOID if Problems With Blood Pressure         Heartburn: Avoid lying down for at least 1 hour after meals  Aciphex      Maalox     Rash:  Milk of Magnesia     Benadryl    Mylanta       1% Hydrocortisone Cream  Pepcid  Pepcid Complete   Sleep  Aids:  Prevacid      Ambien   Prilosec       Benadryl  Rolaids       Chamomile Tea  Tums (Limit 4/day)     Unisom  Tylenol PM         Warm milk-add vanilla or  Hemorrhoids:       Sugar for taste  Anusol/Anusol H.C.  (RX: Analapram 2.5%)  Sugar Substitutes:  Hydrocortisone OTC     Ok in moderation  Preparation H      Tucks        Vaseline lotion applied to tissue with wiping    Herpes:     Throat:  Acyclovir      Oragel  Famvir  Valtrex     Vaccines:         Flu Shot Leg Cramps:       *Gardasil  Benadryl      Hepatitis A         Hepatitis B Nasal Spray:       Pneumovax  Saline Nasal Spray     Polio Booster         Tetanus Nausea:       Tuberculosis test or PPD  Vitamin B6 25 mg TID   AVOID:    Dramamine      *Gardasil  Emetrol       Live Poliovirus  Ginger Root 250 mg QID    MMR (measles, mumps &  High Complex Carbs @ Bedtime    rebella)  Sea Bands-Accupressure    Varicella (Chickenpox)  Unisom 1/2 tab TID     *No known complications           If received before Pain:         Known pregnancy;   Darvocet       Resume series after  Lortab        Delivery  Percocet    Yeast:   Tramadol      Femstat  Tylenol 3      Gyne-lotrimin  Ultram       Monistat  Vicodin           MISC:         All Sunscreens           Hair Coloring/highlights          Insect Repellant's          (Including DEET)         Mystic Tans   Commonly Asked Questions During Pregnancy   Cats: A parasite can be excreted in cat feces.  To avoid exposure you need to have another person empty the little box.  If you must empty the litter box you will need to wear gloves.  Wash your hands after handling your cat.  This parasite can also be found in raw or undercooked meat so this should also be avoided.  Colds, Sore Throats, Flu: Please check your medication sheet to see what you can take for symptoms.  If your symptoms are unrelieved by these medications please call the office.  Dental Work: Most  any dental work Agricultural consultant recommends is permitted.  X-rays should only be taken during the first trimester if absolutely necessary.  Your abdomen should be shielded with a lead apron during all x-rays.  Please notify your provider prior to receiving any x-rays.  Novocaine is fine; gas is not recommended.  If your dentist requires a note from [place] prior to dental work please call the office and we will provide one for you.  Exercise: Exercise is an important part of staying healthy during your pregnancy.  You may continue most exercises you were accustomed to prior to pregnancy.  Later in your pregnancy you will most likely notice you have difficulty with activities requiring balance like riding a bicycle.  It is important that you listen to your body and avoid activities that put you at a higher risk of falling.  Adequate rest and staying well hydrated are a must!  If you have questions about the safety of specific activities ask your provider.    Exposure to Children with illness: Try to avoid obvious exposure; report any symptoms to [place] when noted,  If you have chicken pos, red measles or mumps, you should be immune to these diseases.   Please do not take any vaccines while pregnant unless you have checked with your OB provider.  Fetal Movement: After 28 weeks we recommend you do "kick counts" twice daily.  Lie or sit down in a calm quiet environment and count your baby movements "kicks".  You should feel your baby at least 10 times per hour.  If you have not felt 10 kicks within the first hour get up, walk around and have something sweet to eat or drink then repeat for an additional hour.  If count remains less than 10 per hour notify your provider.  Fumigating: Follow your pest control agent's advice as to how long to stay out of your home.  Ventilate the area well before re-entering.  Hemorrhoids:   Most over-the-counter preparations can be used during pregnancy.  Check your medication to see what is  safe to use.  It is important to use a stool softener or fiber in your diet and to drink lots of liquids.  If hemorrhoids seem to be getting worse please call the office.   Hot Tubs:  Hot tubs Jacuzzis and saunas are not recommended while pregnant.  These increase your internal body temperature and should be avoided.  Intercourse:  Sexual intercourse is safe during pregnancy as long as you are comfortable, unless otherwise advised by your provider.  Spotting may occur after intercourse; report any bright red bleeding that is heavier than spotting.  Labor:  If you know that you are in labor, please go to the hospital.  If you are unsure, please call the office and let us help you decide what to do.  Lifting, straining, etc:  If your job requires heavy lifting or straining please check with your provider for any limitations.  Generally, you should not lift items heavier than that you can lift simply with your hands and arms (no back muscles)  Painting:  Paint fumes do not harm your pregnancy, but may make you ill and should be avoided if possible.  Latex or water based paints have less odor than oils.  Use adequate ventilation while painting.  Permanents & Hair Color:  Chemicals in hair dyes are not recommended as they cause increase hair dryness which can increase hair loss during pregnancy.  " Highlighting" and permanents are allowed.  Dye may be absorbed differently and permanents may not hold as well during pregnancy.  Sunbathing:  Use a sunscreen, as skin burns easily during pregnancy.  Drink plenty of fluids; avoid over heating.  Tanning Beds:  Because their possible side effects are still unknown, tanning beds are not recommended.  Ultrasound Scans:  Routine ultrasounds are performed at approximately 20 weeks.  You will be able to see your baby's general anatomy an if you would like to know the gender this can usually be determined as well.  If it is questionable when you conceived you may  also  receive an ultrasound early in your pregnancy for dating purposes.  Otherwise ultrasound exams are not routinely performed unless there is a medical necessity.  Although you can request a scan we ask that you pay for it when conducted because insurance does not cover " patient request" scans.  Work: If your pregnancy proceeds without complications you may work until your due date, unless your physician or employer advises otherwise.  Round Ligament Pain/Pelvic Discomfort:  Sharp, shooting pains not associated with bleeding are fairly common, usually occurring in the second trimester of pregnancy.  They tend to be worse when standing up or when you remain standing for long periods of time.  These are the result of pressure of certain pelvic ligaments called "round ligaments".  Rest, Tylenol and heat seem to be the most effective relief.  As the womb and fetus grow, they rise out of the pelvis and the discomfort improves.  Please notify the office if your pain seems different than that described.  It may represent a more serious condition.

## 2024-01-10 NOTE — Progress Notes (Signed)
 New OB Intake  I connected with  Nicole Leonard on 01/10/24 at  9:15 AM EDT by MyChart Video Visit and verified that I am speaking with the correct person using two identifiers. Nurse is located at [facility] and pt is located at home.  I discussed the limitations, risks, security and privacy concerns of performing an evaluation and management service by telephone and the availability of in person appointments. I also discussed with the patient that there may be a patient responsible charge related to this service. The patient expressed understanding and agreed to proceed.  I explained I am completing New OB Intake today. We discussed her EDD of 08/22/24 that is based on Ultrasound. Pt is G2/P0. I reviewed her allergies, medications, Medical/Surgical/OB history, and appropriate screenings. There are cats in the home: no. Based on history, this is a/an pregnancy uncomplicated . Her obstetrical history is significant for N/A.  Patient Active Problem List   Diagnosis Date Noted   Supervision of other normal pregnancy, antepartum 01/10/2024   History of stillbirth 01/10/2024   History of gestational hypertension 01/10/2024   Anxiety 12/27/2023   Mixed hyperlipidemia 07/11/2023   Hypertension 148/92 on 05/10/22 05/10/2022   Morbid obesity (HCC) 301 lbs 05/10/2022   Smoker 1/2-3/4 ppd 05/10/2022   Marijuana use 05/10/2022   IUFD at 31.5  wks 02/02/22 02/01/2022    Concerns addressed today: None  Delivery Plans:  Plans to deliver at [facility].  Anatomy US  Explained Anatomy US  will be scheduled around [redacted] weeks gestational age.  Labs Discussed genetic screening with patient. Patient desires genetic testing to be drawn at new OB visit. Discussed possible labs to be drawn at new OB appointment.  COVID Vaccine Patient has had 1 COVID vaccine.   Social Determinants of Health Food Insecurity: denies food insecurity  Transportation: Patient denies transportation  needs. Childcare: Discussed no children allowed at ultrasound appointments.   First visit review I reviewed new OB appt with pt. I explained she will have blood work and pap smear/pelvic exam if indicated. Explained pt will be seen by Quince Bryant, CNM at first visit; encounter routed to appropriate provider.   Higinio Love, CMA 01/10/2024  9:53 AM

## 2024-01-23 NOTE — Progress Notes (Signed)
 LMP 11/19/2023    Subjective:    Patient ID: Nicole Leonard, female    DOB: [birth year], 28 y.o.   MRN: [id number]  HPI: Nicole Leonard is a 28 y.o. female  No chief complaint on file.   Discussed the use of AI scribe software for clinical note transcription with the patient, who gave verbal consent to proceed.  History of Present Illness          12/27/2023    8:37 AM 08/08/2023    8:27 AM 07/11/2023    8:08 AM  Depression screen PHQ 2/9  Decreased Interest 2 0 0  Down, Depressed, Hopeless 0 0 0  PHQ - 2 Score 2 0 0  Altered sleeping 1 0   Tired, decreased energy 2 1   Change in appetite 1 0   Feeling bad or failure about yourself  1 0   Trouble concentrating 1 0   Moving slowly or fidgety/restless 1 0   Suicidal thoughts 0 0   PHQ-9 Score 9 1   Difficult doing work/chores Somewhat difficult Not difficult at all     Relevant past medical, surgical, family and social history reviewed and updated as indicated. Interim medical history since our last visit reviewed. Allergies and medications reviewed and updated.  Review of Systems  Per HPI unless specifically indicated above     Objective:      LMP 11/19/2023   {Vitals History (Optional):23777} Wt Readings from Last 3 Encounters:  12/29/23 276 lb 14.4 oz (125.6 kg)  12/27/23 276 lb 14.4 oz (125.6 kg)  12/19/23 280 lb (127 kg)    Physical Exam Physical Exam    Results for orders placed or performed during the hospital encounter of 12/29/23  hCG, quantitative, pregnancy   Collection Time: 12/29/23  2:10 AM  Result Value Ref Range   hCG, Beta Chain, Quant, S 9,084 (H) <5 mIU/mL  CBC with Differential   Collection Time: 12/29/23  2:10 AM  Result Value Ref Range   WBC 11.2 (H) 4.0 - 10.5 K/uL   RBC 4.25 3.87 - 5.11 MIL/uL   Hemoglobin 13.0 12.0 - 15.0 g/dL   HCT 40.9 81.1 - 91.4 %   MCV 89.9 80.0 - 100.0 fL   MCH 30.6 26.0 - 34.0 pg   MCHC 34.0 30.0 - 36.0 g/dL   RDW 78.2 95.6 - 21.3 %    Platelets 364 150 - 400 K/uL   nRBC 0.0 0.0 - 0.2 %   Neutrophils Relative % 56 %   Neutro Abs 6.1 1.7 - 7.7 K/uL   Lymphocytes Relative 36 %   Lymphs Abs 4.1 (H) 0.7 - 4.0 K/uL   Monocytes Relative 6 %   Monocytes Absolute 0.7 0.1 - 1.0 K/uL   Eosinophils Relative 2 %   Eosinophils Absolute 0.3 0.0 - 0.5 K/uL   Basophils Relative 0 %   Basophils Absolute 0.0 0.0 - 0.1 K/uL   Immature Granulocytes 0 %   Abs Immature Granulocytes 0.03 0.00 - 0.07 K/uL  ABO/Rh   Collection Time: 12/29/23  2:10 AM  Result Value Ref Range   ABO/RH(D)      O POS Performed at [facility], [address] [place], [place] [address]   Urinalysis, Routine w reflex microscopic -Urine, Clean Catch   Collection Time: 12/29/23  2:12 AM  Result Value Ref Range   Color, Urine YELLOW (A) YELLOW   APPearance HAZY (A) CLEAR   Specific Gravity, Urine 1.030 1.005 -  1.030   pH 6.0 5.0 - 8.0   Glucose, UA NEGATIVE NEGATIVE mg/dL   Hgb urine dipstick NEGATIVE NEGATIVE   Bilirubin Urine NEGATIVE NEGATIVE   Ketones, ur NEGATIVE NEGATIVE mg/dL   Protein, ur 30 (A) NEGATIVE mg/dL   Nitrite NEGATIVE NEGATIVE   Leukocytes,Ua NEGATIVE NEGATIVE   RBC / HPF 0-5 0 - 5 RBC/hpf   WBC, UA 0-5 0 - 5 WBC/hpf   Bacteria, UA NONE SEEN NONE SEEN   Squamous Epithelial / HPF 11-20 0 - 5 /HPF   Mucus PRESENT   Comprehensive metabolic panel   Collection Time: 12/29/23  2:19 AM  Result Value Ref Range   Sodium 134 (L) 135 - 145 mmol/L   Potassium 4.0 3.5 - 5.1 mmol/L   Chloride 107 98 - 111 mmol/L   CO2 21 (L) 22 - 32 mmol/L   Glucose, Bld 93 70 - 99 mg/dL   BUN 7 6 - 20 mg/dL   Creatinine, Ser 1.61 0.44 - 1.00 mg/dL   Calcium 9.1 8.9 - 09.6 mg/dL   Total Protein 7.1 6.5 - 8.1 g/dL   Albumin 3.7 3.5 - 5.0 g/dL   AST 14 (L) 15 - 41 U/L   ALT 19 0 - 44 U/L   Alkaline Phosphatase 51 38 - 126 U/L   Total Bilirubin 1.0 0.0 - 1.2 mg/dL   GFR, Estimated >04 >54 mL/min   Anion gap 6 5 - 15   {Labs  (Optional):23779}       Assessment & Plan:   Problem List Items Addressed This Visit   None    Assessment and Plan Assessment & Plan         Follow up plan: No follow-ups on file.

## 2024-01-24 ENCOUNTER — Encounter: Payer: Self-pay | Admitting: Nurse Practitioner

## 2024-01-24 ENCOUNTER — Ambulatory Visit: Admitting: Nurse Practitioner

## 2024-01-24 VITALS — BP 120/82 | HR 104 | Temp 98.2°F | Ht 64.0 in | Wt 292.9 lb

## 2024-01-24 DIAGNOSIS — O1211 Gestational proteinuria, first trimester: Secondary | ICD-10-CM

## 2024-01-24 DIAGNOSIS — F419 Anxiety disorder, unspecified: Secondary | ICD-10-CM

## 2024-01-24 DIAGNOSIS — R635 Abnormal weight gain: Secondary | ICD-10-CM | POA: Diagnosis not present

## 2024-01-24 LAB — POCT URINALYSIS DIPSTICK
Blood, UA: NEGATIVE
Glucose, UA: NEGATIVE
Ketones, UA: NEGATIVE
Leukocytes, UA: NEGATIVE
Nitrite, UA: NEGATIVE
Odor: NORMAL
Protein, UA: POSITIVE — AB
Spec Grav, UA: 1.015 (ref 1.010–1.025)
Urobilinogen, UA: 1 U/dL
pH, UA: 5 (ref 5.0–8.0)

## 2024-01-25 LAB — URINE CULTURE
MICRO NUMBER:: 16549593
Result:: NO GROWTH
SPECIMEN QUALITY:: ADEQUATE

## 2024-01-25 LAB — MICROALBUMIN / CREATININE URINE RATIO
Creatinine, Urine: 226 mg/dL (ref 20–275)
Microalb Creat Ratio: 3 mg/g{creat} (ref <30)
Microalb, Ur: 0.7 mg/dL

## 2024-01-27 ENCOUNTER — Ambulatory Visit: Payer: Self-pay | Admitting: Nurse Practitioner

## 2024-01-30 LAB — CBC WITH DIFFERENTIAL/PLATELET
Absolute Lymphocytes: 2958 {cells}/uL (ref 850–3900)
Absolute Monocytes: 731 {cells}/uL (ref 200–950)
Basophils Absolute: 35 {cells}/uL (ref 0–200)
Basophils Relative: 0.3 %
Eosinophils Absolute: 197 {cells}/uL (ref 15–500)
Eosinophils Relative: 1.7 %
HCT: 37.5 % (ref 35.0–45.0)
Hemoglobin: 12.1 g/dL (ref 11.7–15.5)
MCH: 29.7 pg (ref 27.0–33.0)
MCHC: 32.3 g/dL (ref 32.0–36.0)
MCV: 91.9 fL (ref 80.0–100.0)
MPV: 8.6 fL (ref 7.5–12.5)
Monocytes Relative: 6.3 %
Neutro Abs: 7679 {cells}/uL (ref 1500–7800)
Neutrophils Relative %: 66.2 %
Platelets: 402 10*3/uL — ABNORMAL HIGH (ref 140–400)
RBC: 4.08 10*6/uL (ref 3.80–5.10)
RDW: 12.8 % (ref 11.0–15.0)
Total Lymphocyte: 25.5 %
WBC: 11.6 10*3/uL — ABNORMAL HIGH (ref 3.8–10.8)

## 2024-01-30 LAB — COMPREHENSIVE METABOLIC PANEL WITH GFR
AG Ratio: 1.4 (calc) (ref 1.0–2.5)
ALT: 20 U/L (ref 6–29)
AST: 13 U/L (ref 10–30)
Albumin: 3.9 g/dL (ref 3.6–5.1)
Alkaline phosphatase (APISO): 60 U/L (ref 31–125)
BUN: 10 mg/dL (ref 7–25)
CO2: 21 mmol/L (ref 20–32)
Calcium: 9.2 mg/dL (ref 8.6–10.2)
Chloride: 105 mmol/L (ref 98–110)
Creat: 0.71 mg/dL (ref 0.50–0.96)
Globulin: 2.7 g/dL (ref 1.9–3.7)
Glucose, Bld: 95 mg/dL (ref 65–99)
Potassium: 4.1 mmol/L (ref 3.5–5.3)
Sodium: 136 mmol/L (ref 135–146)
Total Bilirubin: 0.8 mg/dL (ref 0.2–1.2)
Total Protein: 6.6 g/dL (ref 6.1–8.1)
eGFR: 119 mL/min/{1.73_m2} (ref >=60)

## 2024-01-30 LAB — ANALYZER(R)ANA IFA WITH REFLEX TITER/PATTRN,SYS AUTOIMM PNL1
Anti Nuclear Antibody (ANA): POSITIVE — AB
Anticardiolipin IgA: <2 [APL'U]/mL
Anticardiolipin IgG: <2 [GPL'U]/mL
Anticardiolipin IgM: <2 [MPL'U]/mL
Beta-2 Glyco 1 IgA: <2 U/mL
Beta-2 Glyco 1 IgM: <2 U/mL
Beta-2 Glyco I IgG: <2 U/mL
C3 Complement: 171 mg/dL (ref 83–193)
C4 Complement: 21 mg/dL (ref 15–57)
Centromere Ab Screen: <1 AI
Chromatin (Nucleosomal) Antibody: <1 AI
Cyclic Citrullin Peptide Ab: <16 U
DNA Ab (DS) Crithidia, IFA: NEGATIVE
ENA SM Ab Ser-aCnc: <1 AI
Jo-1 Autoabs: <1 AI
MUTATED CITRULLINATED VIMENTIN (MCV) AB: <20 U/mL (ref <20)
Rheumatoid Factor (IgA): <5 U
Rheumatoid Factor (IgG): <5 U
Rheumatoid Factor (IgM): <5 U
Ribonucleic Protein(ENA) Antibody, IgG: <1 AI
SM/RNP: <1 AI
SSA (Ro) (ENA) Antibody, IgG: <1 AI
SSB (La) (ENA) Antibody, IgG: <1 AI
Scleroderma (Scl-70) (ENA) Antibody, IgG: <1 AI
Thyroperoxidase Ab SerPl-aCnc: 1 [IU]/mL (ref <9)

## 2024-01-30 LAB — HEMOGLOBIN A1C
Hgb A1c MFr Bld: 5 % (ref <5.7)
Mean Plasma Glucose: 97 mg/dL
eAG (mmol/L): 5.4 mmol/L

## 2024-01-30 LAB — ANTI-NUCLEAR AB-TITER (ANA TITER)
ANA TITER: 1:40 {titer} — ABNORMAL HIGH
ANA Titer 1: 1:40 {titer} — ABNORMAL HIGH

## 2024-01-30 LAB — LUPUS ANTICOAGULANT EVAL W/ REFLEX
PTT-LA Screen: 33 s (ref <=40)
dRVVT: 24 s (ref <=45)

## 2024-01-30 LAB — TSH: TSH: 3.51 m[IU]/L

## 2024-02-06 ENCOUNTER — Other Ambulatory Visit: Payer: Self-pay

## 2024-02-06 DIAGNOSIS — Z3A01 Less than 8 weeks gestation of pregnancy: Secondary | ICD-10-CM | POA: Diagnosis not present

## 2024-02-06 DIAGNOSIS — I10 Essential (primary) hypertension: Secondary | ICD-10-CM | POA: Diagnosis not present

## 2024-02-06 DIAGNOSIS — O200 Threatened abortion: Secondary | ICD-10-CM | POA: Insufficient documentation

## 2024-02-06 DIAGNOSIS — Z5329 Procedure and treatment not carried out because of patient's decision for other reasons: Secondary | ICD-10-CM | POA: Insufficient documentation

## 2024-02-06 DIAGNOSIS — O208 Other hemorrhage in early pregnancy: Secondary | ICD-10-CM | POA: Diagnosis not present

## 2024-02-06 NOTE — ED Triage Notes (Signed)
 Patient C/O vaginal spotting. Patient states that she is 11 or [redacted] weeks pregnant, and had one episode of light pink vaginal bleeding she noticed when she urinated. Patient denies any cramping or blood clots at this time.

## 2024-02-07 ENCOUNTER — Emergency Department
Admission: EM | Admit: 2024-02-07 | Discharge: 2024-02-07 | Discharge status: Against Medical Advice | Attending: Emergency Medicine | Admitting: Emergency Medicine

## 2024-02-07 ENCOUNTER — Ambulatory Visit (INDEPENDENT_AMBULATORY_CARE_PROVIDER_SITE_OTHER): Admitting: Certified Nurse Midwife

## 2024-02-07 VITALS — BP 132/88 | HR 87 | Wt 294.4 lb

## 2024-02-07 DIAGNOSIS — Z8759 Personal history of other complications of pregnancy, childbirth and the puerperium: Secondary | ICD-10-CM | POA: Diagnosis not present

## 2024-02-07 DIAGNOSIS — O200 Threatened abortion: Secondary | ICD-10-CM

## 2024-02-07 DIAGNOSIS — O039 Complete or unspecified spontaneous abortion without complication: Secondary | ICD-10-CM | POA: Diagnosis not present

## 2024-02-07 DIAGNOSIS — Z3A11 11 weeks gestation of pregnancy: Secondary | ICD-10-CM

## 2024-02-07 DIAGNOSIS — Z3A01 Less than 8 weeks gestation of pregnancy: Secondary | ICD-10-CM | POA: Diagnosis not present

## 2024-02-07 DIAGNOSIS — O0990 Supervision of high risk pregnancy, unspecified, unspecified trimester: Secondary | ICD-10-CM

## 2024-02-07 DIAGNOSIS — O4690 Antepartum hemorrhage, unspecified, unspecified trimester: Secondary | ICD-10-CM

## 2024-02-07 DIAGNOSIS — O99210 Obesity complicating pregnancy, unspecified trimester: Secondary | ICD-10-CM | POA: Insufficient documentation

## 2024-02-07 LAB — URINALYSIS, ROUTINE W REFLEX MICROSCOPIC
Bilirubin Urine: NEGATIVE
Glucose, UA: NEGATIVE mg/dL
Ketones, ur: NEGATIVE mg/dL
Leukocytes,Ua: NEGATIVE
Nitrite: NEGATIVE
Protein, ur: NEGATIVE mg/dL
Specific Gravity, Urine: 1.02 (ref 1.005–1.030)
pH: 6 (ref 5.0–8.0)

## 2024-02-07 LAB — COMPREHENSIVE METABOLIC PANEL WITH GFR
ALT: 25 U/L (ref 0–44)
AST: 19 U/L (ref 15–41)
Albumin: 3.6 g/dL (ref 3.5–5.0)
Alkaline Phosphatase: 51 U/L (ref 38–126)
Anion gap: 5 (ref 5–15)
BUN: 12 mg/dL (ref 6–20)
CO2: 22 mmol/L (ref 22–32)
Calcium: 8.9 mg/dL (ref 8.9–10.3)
Chloride: 108 mmol/L (ref 98–111)
Creatinine, Ser: 0.85 mg/dL (ref 0.44–1.00)
GFR, Estimated: >60 mL/min (ref >60)
Glucose, Bld: 88 mg/dL (ref 70–99)
Potassium: 3.8 mmol/L (ref 3.5–5.1)
Sodium: 135 mmol/L (ref 135–145)
Total Bilirubin: 1.3 mg/dL — ABNORMAL HIGH (ref 0.0–1.2)
Total Protein: 6.9 g/dL (ref 6.5–8.1)

## 2024-02-07 LAB — CBC
HCT: 36 % (ref 36.0–46.0)
Hemoglobin: 12.2 g/dL (ref 12.0–15.0)
MCH: 30.8 pg (ref 26.0–34.0)
MCHC: 33.9 g/dL (ref 30.0–36.0)
MCV: 90.9 fL (ref 80.0–100.0)
Platelets: 373 10*3/uL (ref 150–400)
RBC: 3.96 MIL/uL (ref 3.87–5.11)
RDW: 13.4 % (ref 11.5–15.5)
WBC: 11.1 10*3/uL — ABNORMAL HIGH (ref 4.0–10.5)
nRBC: 0 % (ref 0.0–0.2)

## 2024-02-07 LAB — POC URINE PREG, ED: Preg Test, Ur: POSITIVE — AB

## 2024-02-07 LAB — HCG, QUANTITATIVE, PREGNANCY: hCG, Beta Chain, Quant, S: 1371 m[IU]/mL — ABNORMAL HIGH (ref <5)

## 2024-02-07 MED ORDER — ACETAMINOPHEN-CODEINE 300-30 MG PO TABS: 2.0000 | ORAL_TABLET | ORAL | 0 refills | Status: AC | PRN

## 2024-02-07 MED ORDER — ONDANSETRON 4 MG PO TBDP: 4.0000 mg | ORAL_TABLET | Freq: Three times a day (TID) | ORAL | 1 refills | Status: DC | PRN

## 2024-02-07 MED ORDER — MISOPROSTOL 200 MCG PO TABS: ORAL_TABLET | ORAL | 1 refills | Status: DC

## 2024-02-07 NOTE — Discharge Instructions (Signed)
 See your gynecologist in the morning.  Thank you for choosing us  for your health care today!  Please see your primary doctor this week for a follow up appointment.   If you have any new, worsening, or unexpected symptoms call your doctor right away or come back to the emergency department for reevaluation.  It was my pleasure to care for you today.   Arron Large Margery Sheets, MD

## 2024-02-07 NOTE — ED Provider Notes (Signed)
 [facility] Provider Note    Event Date/Time   First MD Initiated Contact with Patient 02/07/24 0022     (approximate)   History   Vaginal Bleeding   HPI  Nicole Leonard is a 28 y.o. female   Past medical history of hypertension, prior miscarriage who presents to the Emergency Department with vaginal spotting early in pregnancy.  She is approximately 11 weeks and has confirmed IUP in previous ultrasound obtained in the emergency department last month.  She has yet to establish care with gynecology but does have an appointment in the morning.  When she went to urinate she noticed some vaginal spotting.  His symptoms subsided.  She does not have any pelvic cramping or abdominal pain and has no other acute medical complaints or symptoms.  Independent Historian contributed to assessment above: Husband at bedside to corroborate information given above  External Medical Documents Reviewed: Ultrasound obtained last month showing confirmed IUP about 6 weeks      Physical Exam   Triage Vital Signs: ED Triage Vitals  Encounter Vitals Group     BP 02/07/24 0006 134/78     Girls Systolic BP Percentile --      Girls Diastolic BP Percentile --      Boys Systolic BP Percentile --      Boys Diastolic BP Percentile --      Pulse Rate 02/07/24 0006 90     Resp 02/07/24 0006 18     Temp 02/07/24 0006 98.7 F (37.1 C)     Temp Source 02/07/24 0006 Oral     SpO2 02/07/24 0006 100 %     Weight 02/06/24 2357 293 lb (132.9 kg)     Height 02/06/24 2357 5' 4 (1.626 m)     Head Circumference --      Peak Flow --      Pain Score 02/06/24 2357 0     Pain Loc --      Pain Education --      Exclude from Growth Chart --     Most recent vital signs: Vitals:   02/07/24 0006  BP: 134/78  Pulse: 90  Resp: 18  Temp: 98.7 F (37.1 C)  SpO2: 100%    General: Awake, no distress.  CV:  Good peripheral perfusion.  Resp:  Normal effort.  Abd:  No distention.   Other:  Awake alert comfortable with normal vital signs, and has a soft benign abdominal exam.  Defers pelvic exam.  Bedside ultrasound does show gestational sac but no obvious IUP   ED Results / Procedures / Treatments   Labs (all labs ordered are listed, but only abnormal results are displayed) Labs Reviewed  CBC - Abnormal; Notable for the following components:      Result Value   WBC 11.1 (*)    All other components within normal limits  COMPREHENSIVE METABOLIC PANEL WITH GFR - Abnormal; Notable for the following components:   Total Bilirubin 1.3 (*)    All other components within normal limits  HCG, QUANTITATIVE, PREGNANCY - Abnormal; Notable for the following components:   hCG, Beta Chain, Quant, S 1,371 (*)    All other components within normal limits  URINALYSIS, ROUTINE W REFLEX MICROSCOPIC - Abnormal; Notable for the following components:   Color, Urine YELLOW (*)    APPearance HAZY (*)    Hgb urine dipstick LARGE (*)    Bacteria, UA RARE (*)    All other components within normal  limits  POC URINE PREG, ED - Abnormal; Notable for the following components:   Preg Test, Ur Positive (*)    All other components within normal limits     I ordered and reviewed the above labs they are notable for her pregnancy test is positive beta hCG is decreased 1300 from 9000 last month  PROCEDURES:  Critical Care performed: No  Procedures   MEDICATIONS ORDERED IN ED: Medications - No data to display  IMPRESSION / MDM / ASSESSMENT AND PLAN / ED COURSE  I reviewed the triage vital signs and the nursing notes.                                Patient's presentation is most consistent with acute presentation with potential threat to life or bodily function.  Differential diagnosis includes, but is not limited to, ectopic pregnancy, threatened miscarriage, miscarriage, intra-abdominal infection, urinary tract infection   The patient is on the cardiac monitor to evaluate for  evidence of arrhythmia and/or significant heart rate changes.  MDM:     Threatened miscarriage in this [redacted] weeks pregnant woman with vaginal spotting only.  Benign abdominal exam rules against surgical abdominal pathologies, labs reassuring overall.  She left before.  Quantitative hCG was resulted and understands that she should follow-up with gynecology as scheduled tomorrow morning and deferred repeat ultrasound examination today as she already has known IUP from previous testing.  I offered for formal ultrasound today for diagnostic purposes but she declined stating that she would rather go home and get a night sleep before the gynecology appointment tomorrow I think this is reasonable.  We discussed possibilities including threatened miscarriage and she understands the importance of follow-up with gynecology.  Patient is Rh+ on lab testing obtained previously defer RhoGAM.  She has asymptomatic bacteriuria and a sample that is contaminated with squamous epithelial cells, defer treatment and she can recheck with her gynecology tomorrow.       FINAL CLINICAL IMPRESSION(S) / ED DIAGNOSES   Final diagnoses:  Vaginal bleeding in pregnancy  Threatened miscarriage     Rx / DC Orders   ED Discharge Orders     None        Note:  This document was prepared using Dragon voice recognition software and may include unintentional dictation errors.    Buell Carmin, MD 02/07/24 [phone]

## 2024-02-09 NOTE — Progress Notes (Signed)
 [name], FNP   Chief Complaint  Patient presents with   Vaginal Bleeding    HPI:      Nicole Leonard is a 28 y.o. G2P0100 whose LMP was Patient's last menstrual period was 11/19/2023., presents today for new OB, however she presented to ED last night with episode of vaginal bleeding. Bleeding has subsided but hcg in ED was significantly lower than prior result 1 month ago. She is concerned about miscarriage.    Patient Active Problem List   Diagnosis Date Noted   Obesity affecting pregnancy, antepartum 02/07/2024   Supervision of high risk pregnancy, antepartum 01/10/2024   History of stillbirth 01/10/2024   History of gestational hypertension 01/10/2024   Anxiety 12/27/2023   Mixed hyperlipidemia 07/11/2023   Hypertension 148/92 on 05/10/22 05/10/2022   Morbid obesity (HCC) 301 lbs 05/10/2022   Smoker 1/2-3/4 ppd 05/10/2022   Marijuana use 05/10/2022   IUFD at 31.5  wks 02/02/22 02/01/2022    No past surgical history on file.  Family History  Problem Relation Age of Onset   Diabetes Mother    Heart attack Father    Diabetes Father    Healthy Sister    Diabetes Brother    Diabetes Maternal Grandmother    Breast cancer Maternal Great-grandfather        unknown age    Social History   Socioeconomic History   Marital status: Married    Spouse name: Nikos   Number of children: Not on file   Years of education: Not on file   Highest education level: 12th grade  Occupational History   Not on file  Tobacco Use   Smoking status: Every Day    Current packs/day: 0.00    Average packs/day: 0.5 packs/day for 10.0 years (5.0 ttl pk-yrs)    Types: Cigarettes    Start date: 08/05/2011    Last attempt to quit: 08/04/2021    Years since quitting: 2.5   Smokeless tobacco: Never  Vaping Use   Vaping status: Never Used  Substance and Sexual Activity   Alcohol use: Not Currently    Alcohol/week: 5.0 standard drinks of alcohol    Types: 5 Shots of liquor per  week    Comment: last use 02/20/22 3x/yr   Drug use: Yes    Types: Marijuana    Comment: some days   Sexual activity: Yes    Partners: Male    Birth control/protection: None  Other Topics Concern   Not on file  Social History Narrative   Not on file   Social Drivers of Health   Financial Resource Strain: Low Risk  (01/08/2024)   Overall Financial Resource Strain (CARDIA)    Difficulty of Paying Living Expenses: Not hard at all  Food Insecurity: No Food Insecurity (01/08/2024)   Hunger Vital Sign    Worried About Running Out of Food in the Last Year: Never true    Ran Out of Food in the Last Year: Never true  Transportation Needs: No Transportation Needs (01/08/2024)   PRAPARE - Administrator, Civil Service (Medical): No    Lack of Transportation (Non-Medical): No  Physical Activity: Insufficiently Active (01/08/2024)   Exercise Vital Sign    Days of Exercise per Week: 3 days    Minutes of Exercise per Session: 10 min  Stress: No Stress Concern Present (01/08/2024)   [employer] of Occupational Health - Occupational Stress Questionnaire    Feeling of Stress : Not at  all  Social Connections: Moderately Integrated (01/08/2024)   Social Connection and Isolation Panel    Frequency of Communication with Friends and Family: More than three times a week    Frequency of Social Gatherings with Friends and Family: Once a week    Attends Religious Services: 1 to 4 times per year    Active Member of [employer] or Organizations: No    Attends Engineer, structural: Not on file    Marital Status: Married  Catering manager Violence: Not At Risk (01/10/2024)   Humiliation, Afraid, Rape, and Kick questionnaire    Fear of Current or Ex-Partner: No    Emotionally Abused: No    Physically Abused: No    Sexually Abused: No    Outpatient Medications Prior to Visit  Medication Sig Dispense Refill   cetirizine (ZYRTEC) 10 MG tablet Take 10 mg by mouth daily.     labetalol   (NORMODYNE ) 100 MG tablet Take 1 tablet (100 mg total) by mouth 2 (two) times daily. 180 tablet 1   Prenatal Vit-Fe Fumarate-FA (PRENATAL PO) Take by mouth.     omeprazole  (PRILOSEC) 20 MG capsule Take 1 capsule (20 mg total) by mouth daily. (Patient not taking: Reported on 02/07/2024) 90 capsule 1   sertraline  (ZOLOFT ) 25 MG tablet Take 1 tablet (25 mg total) by mouth daily. (Patient not taking: Reported on 02/07/2024) 30 tablet 0   No facility-administered medications prior to visit.      ROS:  Review of Systems  Constitutional: Negative.   Respiratory: Negative.    Cardiovascular: Negative.   Genitourinary:  Positive for vaginal bleeding.     OBJECTIVE:   Vitals:  BP 132/88   Pulse 87   Wt 294 lb 6.4 oz (133.5 kg)   LMP 11/19/2023   BMI 50.53 kg/m   Physical Exam Constitutional:      General: She is not in acute distress.    Appearance: Normal appearance.   Neurological:     Mental Status: She is alert.   Psychiatric:        Attention and Perception: Attention normal.        Mood and Affect: Mood is anxious. Affect is tearful.        Behavior: Behavior normal. Behavior is cooperative.        Thought Content: Thought content normal.     Results:   Component Ref Range & Units (hover) 2 d ago 1 mo ago 1 yr ago 5 yr ago  hCG, Beta Chain, Quant, S 1,371 High  9,084 High  CM <1 CM <1 CM  Comment:          GEST. AGE      CONC.  (mIU/mL)   <=1 WEEK        5 - 50     2 WEEKS       50 - 500     3 WEEKS       100 - 10,000     4 WEEKS     1,000 - 30,000     5 WEEKS     3,500 - 115,000   6-8 WEEKS     12,000 - 270,000    12 WEEKS     15,000 - 220,000    Assessment/Plan: Miscarriage - Plan: misoprostol  (CYTOTEC ) 200 MCG tablet, acetaminophen -codeine  (TYLENOL  #3) 300-30 MG tablet, ondansetron  (ZOFRAN -ODT) 4 MG disintegrating tablet  History of stillbirth  History of gestational hypertension  Reviewed diagnosis of miscarriage based on hcg decline &  vaginal  bleeding. Offered ultrasound, declined. Management options including watchful waiting/expectant management, medication or procedure reviewed. Discussed risks of infection, heavy bleeding and potential need for D&C if expectant management or medication management do not result in complete passage of pregnancy tissue. After discussion Keisy desires medication management. Reviewed use of misoprostol , oral or vaginal administration, pain & nausea management. Discussed signs of hemorrhage, infection & when to seek urgent care. She will return Monday, (3 days after taking), for repeat hcg. Reviewed we will follow hcg until <5.  She does not desire another pregnancy at this time, will discuss contraceptive options at later appointment.  Meds ordered this encounter  Medications   misoprostol  (CYTOTEC ) 200 MCG tablet    Sig: Place four tablets in between your gums and cheeks (two tablets on each side) as instructed OR insert four tablets vaginally    Dispense:  4 tablet    Refill:  1   acetaminophen -codeine  (TYLENOL  #3) 300-30 MG tablet    Sig: Take 2 tablets by mouth every 4 (four) hours as needed for up to 3 days for moderate pain (pain score 4-6).    Dispense:  15 tablet    Refill:  0   ondansetron  (ZOFRAN -ODT) 4 MG disintegrating tablet    Sig: Take 1 tablet (4 mg total) by mouth every 8 (eight) hours as needed.    Dispense:  10 tablet    Refill:  1     Harlene LITTIE Cisco, CNM 02/09/2024 11:46 AM

## 2024-02-10 ENCOUNTER — Other Ambulatory Visit

## 2024-02-10 ENCOUNTER — Other Ambulatory Visit: Payer: Self-pay

## 2024-02-10 DIAGNOSIS — O039 Complete or unspecified spontaneous abortion without complication: Secondary | ICD-10-CM

## 2024-02-10 DIAGNOSIS — Z8759 Personal history of other complications of pregnancy, childbirth and the puerperium: Secondary | ICD-10-CM

## 2024-02-11 ENCOUNTER — Ambulatory Visit: Payer: BC Managed Care – PPO | Admitting: Nurse Practitioner

## 2024-02-11 ENCOUNTER — Ambulatory Visit: Payer: Self-pay | Admitting: Certified Nurse Midwife

## 2024-02-11 LAB — BETA HCG QUANT (REF LAB): hCG Quant: 63 m[IU]/mL

## 2024-02-12 NOTE — Telephone Encounter (Signed)
 The patient delcined scheduling for 6/30 needing AM only appointments. The patient is scheduled for 7/10.

## 2024-02-12 NOTE — Progress Notes (Deleted)
   LMP 11/19/2023    Subjective:    Patient ID: Nicole Leonard, female    DOB: 07-18-1996, 28 y.o.   MRN: [id number]  HPI: Nicole Leonard is a 28 y.o. female  No chief complaint on file.   Discussed the use of AI scribe software for clinical note transcription with the patient, who gave verbal consent to proceed.  History of Present Illness          12/27/2023    8:37 AM 08/08/2023    8:27 AM 07/11/2023    8:08 AM  Depression screen PHQ 2/9  Decreased Interest 2 0 0  Down, Depressed, Hopeless 0 0 0  PHQ - 2 Score 2 0 0  Altered sleeping 1 0   Tired, decreased energy 2 1   Change in appetite 1 0   Feeling bad or failure about yourself  1 0   Trouble concentrating 1 0   Moving slowly or fidgety/restless 1 0   Suicidal thoughts 0 0   PHQ-9 Score 9 1   Difficult doing work/chores Somewhat difficult Not difficult at all     Relevant past medical, surgical, family and social history reviewed and updated as indicated. Interim medical history since our last visit reviewed. Allergies and medications reviewed and updated.  Review of Systems  Per HPI unless specifically indicated above     Objective:     LMP 11/19/2023   {Vitals History (Optional):23777} Wt Readings from Last 3 Encounters:  02/07/24 294 lb 6.4 oz (133.5 kg)  02/06/24 293 lb (132.9 kg)  01/24/24 292 lb 14.4 oz (132.9 kg)    Physical Exam Physical Exam    Results for orders placed or performed in visit on 02/10/24  Beta hCG quant (ref lab)   Collection Time: 02/10/24 10:21 AM  Result Value Ref Range   hCG Quant 63 mIU/mL   {Labs (Optional):23779}       Assessment & Plan:   Problem List Items Addressed This Visit   None    Assessment and Plan Assessment & Plan         Follow up plan: No follow-ups on file.

## 2024-02-13 ENCOUNTER — Ambulatory Visit: Admitting: Nurse Practitioner

## 2024-02-17 ENCOUNTER — Ambulatory Visit: Admitting: Certified Nurse Midwife

## 2024-02-18 ENCOUNTER — Encounter: Payer: Self-pay | Admitting: Nurse Practitioner

## 2024-02-18 ENCOUNTER — Ambulatory Visit (INDEPENDENT_AMBULATORY_CARE_PROVIDER_SITE_OTHER): Admitting: Nurse Practitioner

## 2024-02-18 DIAGNOSIS — F419 Anxiety disorder, unspecified: Secondary | ICD-10-CM | POA: Diagnosis not present

## 2024-02-18 DIAGNOSIS — I10 Essential (primary) hypertension: Secondary | ICD-10-CM | POA: Diagnosis not present

## 2024-02-18 DIAGNOSIS — E782 Mixed hyperlipidemia: Secondary | ICD-10-CM | POA: Diagnosis not present

## 2024-02-18 DIAGNOSIS — F330 Major depressive disorder, recurrent, mild: Secondary | ICD-10-CM | POA: Insufficient documentation

## 2024-02-18 MED ORDER — WEGOVY 0.25 MG/0.5ML ~~LOC~~ SOAJ: 0.2500 mg | SUBCUTANEOUS | 0 refills | Status: DC

## 2024-02-18 NOTE — Patient Instructions (Signed)
 [facility] Address: [address], [place], [place] [address] Phone: [phone]

## 2024-02-18 NOTE — Progress Notes (Signed)
 BP 128/74   Pulse 96   Temp 97.8 F (36.6 C)   Resp 18   Ht 5' 4 (1.626 m)   Wt 298 lb 11.2 oz (135.5 kg)   LMP 11/19/2023   SpO2 98%   Breastfeeding Unknown   BMI 51.27 kg/m    Subjective:    Patient ID: Nicole Leonard, female    DOB: 1995/10/21, 28 y.o.   MRN: [id number]  HPI: Nicole Leonard is a 28 y.o. female  Chief Complaint  Patient presents with   Medical Management of Chronic Issues    Follow-up ER miscarriage   Obesity    Wants to start wegovy     Discussed the use of AI scribe software for clinical note transcription with the patient, who gave verbal consent to proceed.  History of Present Illness Nicole Leonard is a 27 year old female who presents for follow-up after a recent miscarriage and to discuss starting Wegovy .  She experienced a miscarriage on February 07, 2024, with her last beta quant at 27. She is scheduled for a follow-up with her OBGYN to ensure it returns to zero. She has been placed on birth control and is considering further testing to understand her recurrent pregnancy loss. Previous tests, including antiphospholipid antibodies, returned normal results.  She has a history of obesity and is interested in restarting Wegovy , which she tolerated well previously. She is considering dietary and physical activity modifications to support weight loss.  She has a history of hyperlipidemia and hypertension, for which she is currently taking labetalol  100 mg twice daily.  She also has a history of anxiety and depression, with positive screenings for both. She was previously prescribed Zoloft  but never started the medication.     Starting weight: 298 lbs Starting BMI: 51.27 Waist Measurement : 57 inches  - Encourage continuation of lifestyle modifications, including dietary management and regular exercise. -continue to increase physical activity, getting at least 150 min of physical activity a week.  Work on including Runner, broadcasting/film/video 2 - 3 days a  week.  - continue eating at a calorie deficit 1600-1700 cal a day, eating a well balanced diet with whole foods, avoiding processed foods.        02/18/2024    9:47 AM 12/27/2023    8:37 AM 08/08/2023    8:27 AM  Depression screen PHQ 2/9  Decreased Interest 1 2 0  Down, Depressed, Hopeless 1 0 0  PHQ - 2 Score 2 2 0  Altered sleeping 2 1 0  Tired, decreased energy 1 2 1   Change in appetite 2 1 0  Feeling bad or failure about yourself  2 1 0  Trouble concentrating 2 1 0  Moving slowly or fidgety/restless 0 1 0  Suicidal thoughts 0 0 0  PHQ-9 Score 11 9 1   Difficult doing work/chores Somewhat difficult Somewhat difficult Not difficult at all       02/18/2024    9:52 AM 12/27/2023    8:36 AM 08/08/2023    8:28 AM 04/06/2022   10:46 AM  GAD 7 : Generalized Anxiety Score  Nervous, Anxious, on Edge 2 1 0 1  Control/stop worrying 2 1 0 2  Worry too much - different things 2 1 0 2  Trouble relaxing 1 1 0 1  Restless 0 0 0 1  Easily annoyed or irritable 2 2 0 2  Afraid - awful might happen 2 2 0 3  Total GAD 7 Score 11 8 0  12  Anxiety Difficulty Not difficult at all Somewhat difficult  Somewhat difficult     Relevant past medical, surgical, family and social history reviewed and updated as indicated. Interim medical history since our last visit reviewed. Allergies and medications reviewed and updated.  Review of Systems  Constitutional: Negative for fever or weight change.  Respiratory: Negative for cough and shortness of breath.   Cardiovascular: Negative for chest pain or palpitations.  Gastrointestinal: Negative for abdominal pain, no bowel changes.  Musculoskeletal: Negative for gait problem or joint swelling.  Skin: Negative for rash.  Neurological: Negative for dizziness or headache.  No other specific complaints in a complete review of systems (except as listed in HPI above).      Objective:     BP 128/74   Pulse 96   Temp 97.8 F (36.6 C)   Resp 18   Ht 5' 4  (1.626 m)   Wt 298 lb 11.2 oz (135.5 kg)   LMP 11/19/2023   SpO2 98%   Breastfeeding Unknown   BMI 51.27 kg/m    Wt Readings from Last 3 Encounters:  02/18/24 298 lb 11.2 oz (135.5 kg)  02/07/24 294 lb 6.4 oz (133.5 kg)  02/06/24 293 lb (132.9 kg)    Physical Exam Physical Exam GENERAL: Alert, cooperative, well developed, no acute distress. HEENT: Normocephalic, normal oropharynx, moist mucous membranes. CHEST: Clear to auscultation bilaterally, no wheezes, rhonchi, or crackles. CARDIOVASCULAR: Normal heart rate and rhythm, S1 and S2 normal without murmurs. ABDOMEN: Soft, non-tender, non-distended, without organomegaly, normal bowel sounds. EXTREMITIES: No cyanosis or edema. NEUROLOGICAL: Cranial nerves grossly intact, moves all extremities without gross motor or sensory deficit.   Results for orders placed or performed in visit on 02/10/24  Beta hCG quant (ref lab)   Collection Time: 02/10/24 10:21 AM  Result Value Ref Range   hCG Quant 63 mIU/mL          Assessment & Plan:   Problem List Items Addressed This Visit       Cardiovascular and Mediastinum   Hypertension 148/92 on 05/10/22     Other   Morbid obesity (HCC) 301 lbs - Primary   Relevant Medications   Semaglutide -Weight Management (WEGOVY ) 0.25 MG/0.5ML SOAJ   Mixed hyperlipidemia   Anxiety   Mild episode of recurrent major depressive disorder (HCC)     Assessment and Plan Assessment & Plan Recent miscarriage Recent miscarriage on February 07, 2024, with a last beta quant of 63. Antiphospholipid testing was normal. - Coordinate with OBGYN for follow-up on beta quant levels to ensure it reaches zero. - Provide referral to a fertility specialist in [place].  Obesity Obesity with a desire to restart Wegovy  for weight management. Previously tolerated Wegovy  well. - Prescribe Wegovy , starting at 0.25 mg. - Advise on a calorie deficit diet (1600-1700 calories/day), well-balanced with whole foods, avoiding  processed foods, and increasing physical activity to at least 150 minutes per week, including strength training 2-3 days a week. - Ensure not to get  pregnant while on Wegovy . - Schedule follow-up in 3 months for weight check.  Hypertension Hypertension managed with labetalol  100 mg twice daily.  Depression and anxiety disorder Depression and anxiety with positive screenings. Previously prescribed Zoloft  but never started. -reports she is feeling better without medication        Follow up plan: Return in about 3 months (around 05/20/2024) for follow up.

## 2024-02-26 ENCOUNTER — Encounter: Payer: Self-pay | Admitting: Certified Nurse Midwife

## 2024-02-27 ENCOUNTER — Other Ambulatory Visit: Payer: Self-pay

## 2024-02-27 ENCOUNTER — Encounter: Payer: Self-pay | Admitting: Certified Nurse Midwife

## 2024-02-27 ENCOUNTER — Ambulatory Visit (INDEPENDENT_AMBULATORY_CARE_PROVIDER_SITE_OTHER): Admitting: Certified Nurse Midwife

## 2024-02-27 VITALS — BP 122/92 | HR 99 | Ht 63.0 in | Wt 305.7 lb

## 2024-02-27 DIAGNOSIS — Z3202 Encounter for pregnancy test, result negative: Secondary | ICD-10-CM | POA: Diagnosis not present

## 2024-02-27 DIAGNOSIS — Z8759 Personal history of other complications of pregnancy, childbirth and the puerperium: Secondary | ICD-10-CM

## 2024-02-27 DIAGNOSIS — Z3009 Encounter for other general counseling and advice on contraception: Secondary | ICD-10-CM | POA: Diagnosis not present

## 2024-02-27 DIAGNOSIS — Z30017 Encounter for initial prescription of implantable subdermal contraceptive: Secondary | ICD-10-CM

## 2024-02-27 DIAGNOSIS — Z09 Encounter for follow-up examination after completed treatment for conditions other than malignant neoplasm: Secondary | ICD-10-CM

## 2024-02-27 DIAGNOSIS — O039 Complete or unspecified spontaneous abortion without complication: Secondary | ICD-10-CM

## 2024-02-27 LAB — POCT URINE PREGNANCY: Preg Test, Ur: NEGATIVE

## 2024-02-27 MED ORDER — ELLA 30 MG PO TABS
1.0000 | ORAL_TABLET | Freq: Once | ORAL | 0 refills | Status: AC
  Filled 2024-02-27: qty 1, 1d supply, fill #0

## 2024-02-27 MED ORDER — ETONOGESTREL 68 MG ~~LOC~~ IMPL
68.0000 mg | DRUG_IMPLANT | Freq: Once | SUBCUTANEOUS | Status: AC
  Administered 2024-02-27: 68 mg via SUBCUTANEOUS

## 2024-02-27 MED ORDER — ETONOGESTREL 68 MG ~~LOC~~ IMPL: 68.0000 mg | DRUG_IMPLANT | Freq: Once | SUBCUTANEOUS | Status: DC

## 2024-02-27 NOTE — Progress Notes (Signed)
 Nicole Mliss FALCON, FNP   Chief Complaint  Patient presents with   Miscarriage   Contraception    Nexplanon     HPI:      Nicole Leonard is a 28 y.o. G2P0110 whose LMP was Patient's last menstrual period was 11/19/2023 (exact date)., presents today for miscarriage followup & Nexplanon  placement. Grieving appropriately loss of pregnancy as well as loss of son last year.  Does not desire pregnancy now or in future, has used Nexplanon  3 times in past & would like again today. Last IC last night, unprotected.   Edinburgh Postnatal Depression Scale - 02/27/24 0842       Edinburgh Postnatal Depression Scale:  In the Past 7 Days   I have been able to laugh and see the funny side of things. 0    I have looked forward with enjoyment to things. 1    I have blamed myself unnecessarily when things went wrong. 2    I have been anxious or worried for no good reason. 3    I have felt scared or panicky for no good reason. 3    Things have been getting on top of me. 1    I have been so unhappy that I have had difficulty sleeping. 2    I have felt sad or miserable. 2    I have been so unhappy that I have been crying. 2    The thought of harming myself has occurred to me. 1    Edinburgh Postnatal Depression Scale Total 17         Prescribed Zoloft  by PCP but has not started, interested in peer support, has therapist through work-has not seen them yet. Feels her mood has been impacted by her grief, more so over the last few days.   Upstream - 02/27/24 0945       Pregnancy Intention Screening   Does the patient want to become pregnant in the next year? No    Does the patient's partner want to become pregnant in the next year? No    Would the patient like to discuss contraceptive options today? Yes      Contraception Wrap Up   Current Method No Method - Other Reason    End Method Hormonal Implant    Contraception Counseling Provided Yes    How was the end contraceptive method provided?  Provided on site         The pregnancy intention screening data noted above was reviewed. Potential methods of contraception were discussed. The patient elected to proceed with Hormonal Implant. OB History     Gravida  2   Para  1   Term  0   Preterm  1   AB  1   Living  0      SAB  1   IAB  0   Ectopic  0   Multiple  0   Live Births  0          Patient Active Problem List   Diagnosis Date Noted   Mild episode of recurrent major depressive disorder (HCC) 02/18/2024   Obesity affecting pregnancy, antepartum 02/07/2024   Supervision of high risk pregnancy, antepartum 01/10/2024   History of stillbirth 01/10/2024   History of gestational hypertension 01/10/2024   Anxiety 12/27/2023   Mixed hyperlipidemia 07/11/2023   Hypertension 148/92 on 05/10/22 05/10/2022   Morbid obesity (HCC) 301 lbs 05/10/2022   Smoker 1/2-3/4 ppd 05/10/2022   Marijuana use 05/10/2022  IUFD at 31.5  wks 02/02/22 02/01/2022    History reviewed. No pertinent surgical history.  Family History  Problem Relation Age of Onset   Diabetes Mother    Asthma Mother    Heart attack Father    Diabetes Father    Healthy Sister    Diabetes Brother    Diabetes Maternal Grandmother    Breast cancer Maternal Great-grandfather        unknown age    Social History   Socioeconomic History   Marital status: Married    Spouse name: Midwife   Number of children: Not on file   Years of education: Not on file   Highest education level: GED or equivalent  Occupational History   Not on file  Tobacco Use   Smoking status: Every Day    Current packs/day: 0.00    Average packs/day: 0.5 packs/day for 15.0 years (7.5 ttl pk-yrs)    Types: Cigarettes    Start date: 08/05/2011    Last attempt to quit: 08/04/2021    Years since quitting: 2.5   Smokeless tobacco: Never  Vaping Use   Vaping status: Never Used  Substance and Sexual Activity   Alcohol use: Yes    Alcohol/week: 5.0 standard drinks of  alcohol    Comment: last use 02/20/22 3x/yr   Drug use: Yes    Types: Marijuana    Comment: some days   Sexual activity: Yes    Partners: Male    Birth control/protection: None  Other Topics Concern   Not on file  Social History Narrative   Not on file   Social Drivers of Health   Financial Resource Strain: Low Risk  (02/09/2024)   Overall Financial Resource Strain (CARDIA)    Difficulty of Paying Living Expenses: Not hard at all  Food Insecurity: No Food Insecurity (02/09/2024)   Hunger Vital Sign    Worried About Running Out of Food in the Last Year: Never true    Ran Out of Food in the Last Year: Never true  Transportation Needs: No Transportation Needs (02/09/2024)   PRAPARE - Administrator, Civil Service (Medical): No    Lack of Transportation (Non-Medical): No  Physical Activity: Sufficiently Active (02/09/2024)   Exercise Vital Sign    Days of Exercise per Week: 5 days    Minutes of Exercise per Session: 90 min  Recent Concern: Physical Activity - Insufficiently Active (01/08/2024)   Exercise Vital Sign    Days of Exercise per Week: 3 days    Minutes of Exercise per Session: 10 min  Stress: Stress Concern Present (02/09/2024)   [employer] of Occupational Health - Occupational Stress Questionnaire    Feeling of Stress: To some extent  Social Connections: Moderately Integrated (02/09/2024)   Social Connection and Isolation Panel    Frequency of Communication with Friends and Family: Three times a week    Frequency of Social Gatherings with Friends and Family: Once a week    Attends Religious Services: 1 to 4 times per year    Active Member of [employer] or Organizations: No    Attends Banker Meetings: Not on file    Marital Status: Married  Catering manager Violence: Not At Risk (01/10/2024)   Humiliation, Afraid, Rape, and Kick questionnaire    Fear of Current or Ex-Partner: No    Emotionally Abused: No    Physically Abused: No    Sexually  Abused: No    Outpatient Medications Prior to Visit  Medication Sig Dispense Refill   cetirizine (ZYRTEC) 10 MG tablet Take 10 mg by mouth daily.     labetalol  (NORMODYNE ) 100 MG tablet Take 1 tablet (100 mg total) by mouth 2 (two) times daily. 180 tablet 1   Semaglutide -Weight Management (WEGOVY ) 0.25 MG/0.5ML SOAJ Inject 0.25 mg into the skin once a week. 2 mL 0   No facility-administered medications prior to visit.      ROS:  Review of Systems  Constitutional: Negative.   Respiratory: Negative.    Cardiovascular: Negative.   Genitourinary: Negative.   Psychiatric/Behavioral:  Positive for dysphoric mood.      OBJECTIVE:   Vitals:  BP (!) 122/92   Pulse 99   Ht 5' 3 (1.6 m)   Wt (!) 305 lb 11.2 oz (138.7 kg)   LMP 11/19/2023 (Exact Date)   Breastfeeding No   BMI 54.15 kg/m   Physical Exam Constitutional:      General: She is not in acute distress.    Appearance: Normal appearance. She is not ill-appearing.  Cardiovascular:     Rate and Rhythm: Normal rate.  Pulmonary:     Effort: Pulmonary effort is normal.  Neurological:     General: No focal deficit present.     Mental Status: She is alert and oriented to person, place, and time.  Psychiatric:        Attention and Perception: Attention and perception normal.        Mood and Affect: Mood normal. Affect is flat.        Speech: Speech normal.        Behavior: Behavior normal. Behavior is cooperative.        Thought Content: Thought content normal.     Results: Results for orders placed or performed in visit on 02/27/24 (from the past 24 hours)  POCT urine pregnancy     Status: None   Collection Time: 02/27/24  8:56 AM  Result Value Ref Range   Preg Test, Ur Negative Negative   Nexplanon  insertion.    Last pap smear: Result Date Procedure Results Follow-ups  10/03/2021 Cytology - PAP Neisseria Gonorrhea: Negative Chlamydia: Negative Adequacy: Satisfactory for evaluation; transformation zone  component PRESENT. Diagnosis: - Negative for intraepithelial lesion or malignancy (NILM) Microorganisms: Shift in flora suggestive of bacterial vaginosis Comment: Normal Reference Ranger Chlamydia - Negative Comment: Normal Reference Range Neisseria Gonorrhea - Negative   09/05/2018 Pap IG (Image Guided) Pap Smear: NILM HPV: N/A Transformation Zone: Present      Nexplanon  insertion Procedure Patient identified, informed consent performed, consent signed.   Patient does understand that irregular bleeding is a very common side effect of this medication. She was advised to have backup contraception for at least one week after placement. Pregnancy test in clinic today was negative, given recent unprotected IC Ella  prescribed & Nicolle to take today.  Appropriate time out taken.  Patient's right arm was prepped and draped in the usual sterile fashion. Patient was prepped with betadine and then injected with 2 ml of 1% lidocaine .  Nexplanon  removed from packaging.  Device confirmed in needle, then inserted full length of needle and withdrawn per handbook instructions. Nexplanon  was able to palpated in the patient's arm; patient palpated the insert herself. There was minimal blood loss.  Patient insertion site covered with guaze and a pressure bandage to reduce any bruising.  The patient tolerated the procedure well and was given post procedure instructions.   Assessment/Plan: Miscarriage - Plan: POCT urine pregnancy  History  of stillbirth  Encounter for counseling regarding contraception - Plan: POCT urine pregnancy, DISCONTINUED: etonogestrel  (NEXPLANON ) implant 68 mg  Nexplanon  insertion - Plan: etonogestrel  (NEXPLANON ) implant 68 mg, DISCONTINUED: etonogestrel  (NEXPLANON ) implant 68 mg  Resources for vasectomy as well as virtual support groups through [employer] sent via MyChart.  Meds ordered this encounter  Medications   etonogestrel  (NEXPLANON ) implant 68 mg   DISCONTD:  etonogestrel  (NEXPLANON ) implant 68 mg   ulipristal acetate  (ELLA ) 30 MG tablet    Sig: Take 1 tablet (30 mg total) by mouth once for 1 dose.    Dispense:  1 tablet    Refill:  0     Harlene LITTIE Cisco, CNM 02/27/2024 9:54 AM

## 2024-02-27 NOTE — Patient Instructions (Signed)
 Nexplanon Instructions After Insertion  Keep bandage clean and dry for 24 hours  May use ice/Tylenol/Ibuprofen for soreness or pain  If you develop fever, drainage or increased warmth from incision site-contact office immediately

## 2024-03-06 ENCOUNTER — Telehealth: Payer: Self-pay | Admitting: Nurse Practitioner

## 2024-03-06 NOTE — Telephone Encounter (Unsigned)
 Copied from CRM [id number]. Topic: Clinical - Medication Refill >> Mar 06, 2024  3:52 PM Nicole Leonard wrote: Medication: omeprazole  (PRILOSEC) 20 MG capsule  Has the patient contacted their pharmacy? No  This is the patient's preferred pharmacy:  [facility] [address]), [place] - [address] [place]) [place] [address] Phone: [phone] Fax: [phone]  Is this the correct pharmacy for this prescription? Yes  Has the prescription been filled recently? No  Is the patient out of the medication? Yes  Has the patient been seen for an appointment in the last year OR does the patient have an upcoming appointment? Yes  Can we respond through MyChart? Yes  Agent: Please be advised that Rx refills may take up to 3 business days. We ask that you follow-up with your pharmacy.

## 2024-03-17 ENCOUNTER — Ambulatory Visit: Admitting: Family Medicine

## 2024-03-17 ENCOUNTER — Encounter: Payer: Self-pay | Admitting: Family Medicine

## 2024-03-17 DIAGNOSIS — Z308 Encounter for other contraceptive management: Secondary | ICD-10-CM | POA: Diagnosis not present

## 2024-03-17 DIAGNOSIS — Z975 Presence of (intrauterine) contraceptive device: Secondary | ICD-10-CM | POA: Insufficient documentation

## 2024-03-17 DIAGNOSIS — Z3046 Encounter for surveillance of implantable subdermal contraceptive: Secondary | ICD-10-CM | POA: Insufficient documentation

## 2024-03-17 DIAGNOSIS — Z3202 Encounter for pregnancy test, result negative: Secondary | ICD-10-CM

## 2024-03-17 DIAGNOSIS — Z30017 Encounter for initial prescription of implantable subdermal contraceptive: Secondary | ICD-10-CM | POA: Diagnosis not present

## 2024-03-17 LAB — PREGNANCY, URINE: Preg Test, Ur: NEGATIVE

## 2024-03-17 MED ORDER — ETONOGESTREL 68 MG ~~LOC~~ IMPL
68.0000 mg | DRUG_IMPLANT | Freq: Once | SUBCUTANEOUS | Status: AC
  Administered 2024-03-17: 68 mg via SUBCUTANEOUS

## 2024-03-17 NOTE — Progress Notes (Signed)
 [facility] Problem Visit  [facility]  Subjective:  Nicole Leonard is a 28 y.o. being seen today for   No chief complaint on file.  Sharp pains down right arm with any movement or palpation ever since placement on 02/27/24 at [facility]. Nicole Leonard reports the provider placed the Nexplanon  a little further down on her arm and she is now concerned that the Nexplanon  is too deep. No redness, swelling, or discharge from the site.   Old Nexplanon  palpated in right arm. Measured at 12 cm from medial epicondyle, 9 cm inferior to sulcus.   Does the patient have a current or past history of drug use? No   No components found for: HCV]  There are no preventive care reminders to display for this patient.  Review of Systems  Constitutional:  Negative for fever, malaise/fatigue and weight loss.  Respiratory:  Negative for shortness of breath.   Cardiovascular:  Negative for chest pain and palpitations.   The following portions of the patient's history were reviewed and updated as appropriate: allergies, current medications, past family history, past medical history, past social history, past surgical history and problem list. Problem list updated.  See flowsheet for other program required questions.  Objective:  There were no vitals filed for this visit.  Physical Exam Constitutional:      Appearance: Normal appearance.  HENT:     Head: Normocephalic and atraumatic.  Pulmonary:     Effort: Pulmonary effort is normal.  Musculoskeletal:        General: Normal range of motion.  Skin:    General: Skin is warm and dry.  Neurological:     General: No focal deficit present.     Mental Status: She is alert.  Psychiatric:        Mood and Affect: Mood normal.        Behavior: Behavior normal.    Procedure:  Nexplanon  Removal and Insertion  Patient identified, informed consent performed, consent signed.   Patient does understand that irregular bleeding  is a very common side effect of this medication. She was advised to have backup contraception for one week after replacement of the implant. Patient deemed to meet WHO criteria for being reasonably certain she is not pregnant.  Appropriate time out taken. Nexplanon  site identified in the patient's right arm. Area prepped in usual sterile fashon. 3 ml of 1% lidocaine  with epinephrine was used to anesthetize the area at the distal end of the implant. A small stab incision was made right beside the implant on the distal portion. The Nexplanon  rod was grasped using hemostats and removed without difficulty. There was minimal blood loss. There were no complications.   Confirmed correct location of insertion site. The insertion site was identified 9.5 cm from the medial epicondyle of the humerus and 3 cm posterior to (below) the sulcus (groove) between the biceps and triceps muscles of the patient's right arm. This was the location of previous Nexplanon  removal scar; patient amenable to using the scar tissue as new insertion site. New Nexplanon  removed from packaging, device confirmed in needle, then inserted full length of needle and withdrawn per handbook instructions. Nexplanon  was able to palpated in the patient's arm; patient palpated the insert herself.  There was minimal blood loss. Patient insertion site covered with guaze and a pressure bandage to reduce any bruising. The patient tolerated the procedure well and was given post procedure instructions.     Assessment and Plan:  Nicole Leonard is a 28 y.o. female presenting to the [facility] Department for a Women's Health problem visit  Encounter for removal and reinsertion of Nexplanon  Assessment & Plan: UPT negative. Removed and replaced without difficulty. Removed Nexplanon  was measured at 12 cm from medial epicondyle, 9 cm inferior to sulcus prior to removal. New Nexplanon  placed 9.5 cm from medial epicondyle, 3 cm from sulcus. See  procedure note for more.   Orders: -     Pregnancy, urine  Nexplanon  in place Overview: Paucity of notes prior to 2022 regarding original Nexplanon  placement Nexplanon  #1 out 03/15/21 > changed to OCPs Pregnancy > resulted in IUFD June 2023 Nexplanon  #2 in 02/16/22 > out 03/19/23 Pregnancy > resulted in miscarriage June 2025 Nexplanon  #3 in 02/27/24 > out 03/17/24 due to sharp pains with movement and palpation Nexplanon  #4 in 03/17/24     No follow-ups on file.  Future Appointments  Date Time Provider Department Center  05/20/2024  9:00 AM [name], FNP [facility]    Betsey CHRISTELLA Helling, MD

## 2024-03-17 NOTE — Progress Notes (Signed)
 Pt is here for pe and nex removal/insertion.  Pt left without nexplanon  card, inserted in R. Arm and remove by 02/2028.  FP education card given. Larraine JONELLE Northern, RN

## 2024-03-17 NOTE — Assessment & Plan Note (Addendum)
 UPT negative. Removed and replaced without difficulty. Removed Nexplanon  was measured at 12 cm from medial epicondyle, 9 cm inferior to sulcus prior to removal. New Nexplanon  placed 9.5 cm from medial epicondyle, 3 cm from sulcus. See procedure note for more.

## 2024-03-18 NOTE — Progress Notes (Signed)
 Duplicate encounter. See other note for full details.   Dorothyann Helling, MD 03/18/24  12:20 PM

## 2024-03-20 ENCOUNTER — Other Ambulatory Visit: Payer: Self-pay | Admitting: Nurse Practitioner

## 2024-03-22 ENCOUNTER — Other Ambulatory Visit: Payer: Self-pay | Admitting: Nurse Practitioner

## 2024-03-22 DIAGNOSIS — F419 Anxiety disorder, unspecified: Secondary | ICD-10-CM

## 2024-03-23 ENCOUNTER — Other Ambulatory Visit: Payer: Self-pay | Admitting: Nurse Practitioner

## 2024-03-23 MED ORDER — WEGOVY 0.5 MG/0.5ML ~~LOC~~ SOAJ: 0.5000 mg | SUBCUTANEOUS | 0 refills | Status: DC

## 2024-03-23 NOTE — Telephone Encounter (Signed)
 Requested medication (s) are due for refill today: yes  Requested medication (s) are on the active medication list: yes  Last refill:  02/18/24 2 ml  Future visit scheduled: yes  Notes to clinic:  will dosing increase? Sending back to review   Requested Prescriptions  Pending Prescriptions Disp Refills   WEGOVY  0.25 MG/0.5ML SOAJ [Pharmacy Med Name: Wegovy  0.25 MG/0.5ML Subcutaneous Solution Auto-injector] 4 mL 0    Sig: Inject 0.25 mg into the skin once a week.     Endocrinology:  Diabetes - GLP-1 Receptor Agonists - semaglutide  Passed - 03/23/2024  7:42 AM      Passed - HBA1C in normal range and within 180 days    Hgb A1c MFr Bld  Date Value Ref Range Status  01/24/2024 5.0 <5.7 % Final    Comment:    For the purpose of screening for the presence of diabetes: . <5.7%       Consistent with the absence of diabetes 5.7-6.4%    Consistent with increased risk for diabetes             (prediabetes) > or =6.5%  Consistent with diabetes . This assay result is consistent with a decreased risk of diabetes. . Currently, no consensus exists regarding use of hemoglobin A1c for diagnosis of diabetes in children. . According to American Diabetes Association (ADA) guidelines, hemoglobin A1c <7.0% represents optimal control in non-pregnant diabetic patients. Different metrics may apply to specific patient populations.  Standards of Medical Care in Diabetes(ADA). .          Passed - Cr in normal range and within 360 days    Creat  Date Value Ref Range Status  01/24/2024 0.71 0.50 - 0.96 mg/dL Final   Creatinine, Ser  Date Value Ref Range Status  02/07/2024 0.85 0.44 - 1.00 mg/dL Final   Creatinine, Urine  Date Value Ref Range Status  01/24/2024 226 20 - 275 mg/dL Final         Passed - Valid encounter within last 6 months    Recent Outpatient Visits           1 month ago Morbid obesity [facility])   [facility] [name], FNP   1 month ago  Anxiety   [facility] [name], FNP   2 months ago Positive pregnancy test   [facility] [name], FNP   3 months ago Positive pregnancy test   [facility] Leavy Mole, PA-C       Future Appointments             In 1 month Gareth, Mliss FALCON, FNP [facility], [facility]

## 2024-04-09 ENCOUNTER — Other Ambulatory Visit: Payer: Self-pay | Admitting: Nurse Practitioner

## 2024-04-09 DIAGNOSIS — F419 Anxiety disorder, unspecified: Secondary | ICD-10-CM

## 2024-04-10 NOTE — Telephone Encounter (Signed)
 No longer on current medication list Requested Prescriptions  Pending Prescriptions Disp Refills   sertraline  (ZOLOFT ) 25 MG tablet [Pharmacy Med Name: Sertraline  HCl 25 MG Oral Tablet] 30 tablet 0    Sig: Take 1 tablet by mouth once daily     Psychiatry:  Antidepressants - SSRI - sertraline  Passed - 04/10/2024  1:46 PM      Passed - AST in normal range and within 360 days    AST  Date Value Ref Range Status  02/07/2024 19 15 - 41 U/L Final         Passed - ALT in normal range and within 360 days    ALT  Date Value Ref Range Status  02/07/2024 25 0 - 44 U/L Final         Passed - Completed PHQ-2 or PHQ-9 in the last 360 days      Passed - Valid encounter within last 6 months    Recent Outpatient Visits           1 month ago Morbid obesity [facility])   [facility] [name], FNP   2 months ago Anxiety   [facility] [name], FNP   3 months ago Positive pregnancy test   [facility] [name], FNP   3 months ago Positive pregnancy test   [facility] Leavy Mole, PA-C       Future Appointments             In 1 month Gareth, Mliss FALCON, FNP [facility], [facility]

## 2024-04-13 ENCOUNTER — Telehealth: Payer: Self-pay | Admitting: Pharmacy Technician

## 2024-04-13 NOTE — Telephone Encounter (Addendum)
   Pharmacy Patient Advocate Encounter   Received notification from CoverMyMeds that prior authorization for wegovy  is required/requested.   Insurance verification completed.   The patient is [place] through [facility] [place]  .   Per test claim: PA required; PA submitted to above mentioned insurance via Latent Key/confirmation #/EOC [id number] Status is pending   Pharmacy Patient Advocate Encounter  Received notification from [employer] [place]  that Prior Authorization for wegovy  has been APPROVED from 04/13/24 to 04/13/25   PA #/Case ID/Reference #: waiting on full approval

## 2024-04-17 ENCOUNTER — Ambulatory Visit (INDEPENDENT_AMBULATORY_CARE_PROVIDER_SITE_OTHER): Admitting: Nurse Practitioner

## 2024-04-17 ENCOUNTER — Encounter: Payer: Self-pay | Admitting: Certified Nurse Midwife

## 2024-04-17 ENCOUNTER — Encounter: Payer: Self-pay | Admitting: Nurse Practitioner

## 2024-04-17 VITALS — BP 122/84 | HR 86 | Resp 16 | Ht 64.0 in | Wt 305.3 lb

## 2024-04-17 DIAGNOSIS — N920 Excessive and frequent menstruation with regular cycle: Secondary | ICD-10-CM

## 2024-04-17 NOTE — Progress Notes (Signed)
 BP 122/84   Pulse 86   Resp 16   Ht 5' 4 (1.626 m)   Wt (!) 305 lb 4.8 oz (138.5 kg)   LMP 03/17/2024 (Exact Date)   SpO2 100%   BMI 52.40 kg/m    Subjective:    Patient ID: Nicole Leonard, female    DOB: 09-07-1995, 28 y.o.   MRN: [id number]  HPI: Nicole Leonard is a 28 y.o. female  Chief Complaint  Patient presents with   Menstrual Problem    Ongoing for a month, heavy bleeding    Discussed the use of AI scribe software for clinical note transcription with the patient, who gave verbal consent to proceed.  History of Present Illness Nicole Leonard is a 28 year old female who presents with menorrhagia.  Menorrhagia - Heavy menstrual bleeding ongoing for over one month - Bleeding associated with passage of clots - No unusual cramping - No fever - Menorrhagia began after miscarriage on February 07, 2024 -this is her first period since miscarriage -last beta quant was not 0, will recheck  Post-miscarriage status - Recent miscarriage on February 07, 2024   Contraceptive management - Birth control device was initially inserted incorrectly and required reinsertion - Has not returned to OB since reinsertion of birth control  Pregnancy status - Recent pregnancy test at health department was negative  Obesity pharmacotherapy - Wegovy  injections have run out - Previously using 0.5 mg dose - Perceives current dose is less effective than before         04/17/2024    9:58 AM 03/17/2024   10:29 AM 02/18/2024    9:47 AM  Depression screen PHQ 2/9  Decreased Interest 0 0 1  Down, Depressed, Hopeless 0 0 1  PHQ - 2 Score 0 0 2  Altered sleeping 0  2  Tired, decreased energy 0  1  Change in appetite 0  2  Feeling bad or failure about yourself  0  2  Trouble concentrating 0  2  Moving slowly or fidgety/restless 0  0  Suicidal thoughts 0  0  PHQ-9 Score 0  11  Difficult doing work/chores Not difficult at all  Somewhat difficult    Relevant past medical, surgical,  family and social history reviewed and updated as indicated. Interim medical history since our last visit reviewed. Allergies and medications reviewed and updated.  Review of Systems  Ten systems reviewed and is negative except as mentioned in HPI      Objective:     BP 122/84   Pulse 86   Resp 16   Ht 5' 4 (1.626 m)   Wt (!) 305 lb 4.8 oz (138.5 kg)   LMP 03/17/2024 (Exact Date)   SpO2 100%   BMI 52.40 kg/m    Wt Readings from Last 3 Encounters:  04/17/24 (!) 305 lb 4.8 oz (138.5 kg)  03/17/24 298 lb (135.2 kg)  02/27/24 (!) 305 lb 11.2 oz (138.7 kg)    Physical Exam Physical Exam GENERAL: Alert, cooperative, well developed, no acute distress HEENT: Normocephalic, normal oropharynx, moist mucous membranes CHEST: Clear to auscultation bilaterally, No wheezes, rhonchi, or crackles CARDIOVASCULAR: Normal heart rate and rhythm, S1 and S2 normal without murmurs ABDOMEN: Soft, non-tender, non-distended, without organomegaly, Normal bowel sounds EXTREMITIES: No cyanosis or edema NEUROLOGICAL: Cranial nerves grossly intact, Moves all extremities without gross motor or sensory deficit   Results for orders placed or performed in visit on 03/17/24  Pregnancy, urine   Collection  Time: 03/17/24 12:12 PM  Result Value Ref Range   Preg Test, Ur Negative Negative          Assessment & Plan:   Problem List Items Addressed This Visit   None Visit Diagnoses       Menorrhagia with regular cycle    -  Primary   Relevant Orders   CBC with Differential/Platelet   Comprehensive metabolic panel with GFR   TSH   Iron, TIBC and Ferritin Panel   Prolactin   Protime-INR   APTT   VON WILLEBRAND COMPREHENSIVE PANEL   hCG, quantitative, pregnancy        Assessment and Plan Assessment & Plan Menorrhagia after miscarriage Prolonged menstrual bleeding for over a month following a miscarriage on February 07, 2024, with presence of clots. No significant cramping or fever. Differential  diagnosis includes thyroid  dysfunction, retained products, hormone irregularities or clotting abnormalities post-miscarriage. Previous birth control insertion was incorrect, requiring re-insertion by the health department. Negative pregnancy test confirmed by health department. - Order blood work to assess thyroid  function and clotting parameters. - Consider ultrasound pending blood work results. - Instruct to message GYN about ongoing bleeding.  Obesity Current treatment with Wegovy  (semaglutide ) at 0.5 mg is not yielding the same results as previously experienced. Discussion about potential variability in response to medication. She expressed desire to increase dosage. - Order blood work before adjusting Wegovy  dosage. - Plan to send prescription for Wegovy  before next scheduled dose on Thursday.        Follow up plan: Return if symptoms worsen or fail to improve.

## 2024-04-21 ENCOUNTER — Ambulatory Visit: Payer: Self-pay | Admitting: Nurse Practitioner

## 2024-04-21 ENCOUNTER — Other Ambulatory Visit: Payer: Self-pay | Admitting: Nurse Practitioner

## 2024-04-21 MED ORDER — WEGOVY 0.25 MG/0.5ML ~~LOC~~ SOAJ
0.2500 mg | SUBCUTANEOUS | 0 refills | Status: DC
Start: 2024-04-21 — End: 2024-04-23

## 2024-04-23 ENCOUNTER — Other Ambulatory Visit: Payer: Self-pay | Admitting: Nurse Practitioner

## 2024-04-23 LAB — COMPREHENSIVE METABOLIC PANEL WITH GFR
AG Ratio: 1.7 (calc) (ref 1.0–2.5)
ALT: 20 U/L (ref 6–29)
AST: 14 U/L (ref 10–30)
Albumin: 4 g/dL (ref 3.6–5.1)
Alkaline phosphatase (APISO): 66 U/L (ref 31–125)
BUN: 9 mg/dL (ref 7–25)
CO2: 23 mmol/L (ref 20–32)
Calcium: 9.4 mg/dL (ref 8.6–10.2)
Chloride: 108 mmol/L (ref 98–110)
Creat: 0.76 mg/dL (ref 0.50–0.96)
Globulin: 2.4 g/dL (ref 1.9–3.7)
Glucose, Bld: 102 mg/dL — ABNORMAL HIGH (ref 65–99)
Potassium: 3.8 mmol/L (ref 3.5–5.3)
Sodium: 139 mmol/L (ref 135–146)
Total Bilirubin: 0.8 mg/dL (ref 0.2–1.2)
Total Protein: 6.4 g/dL (ref 6.1–8.1)
eGFR: 109 mL/min/1.73m2 (ref >=60)

## 2024-04-23 LAB — IRON,TIBC AND FERRITIN PANEL
%SAT: 17 % (ref 16–45)
Ferritin: 13 ng/mL — ABNORMAL LOW (ref 16–154)
Iron: 63 ug/dL (ref 40–190)
TIBC: 379 ug/dL (ref 250–450)

## 2024-04-23 LAB — CBC WITH DIFFERENTIAL/PLATELET
Absolute Lymphocytes: 2970 {cells}/uL (ref 850–3900)
Absolute Monocytes: 535 {cells}/uL (ref 200–950)
Basophils Absolute: 40 {cells}/uL (ref 0–200)
Basophils Relative: 0.4 %
Eosinophils Absolute: 287 {cells}/uL (ref 15–500)
Eosinophils Relative: 2.9 %
HCT: 38.5 % (ref 35.0–45.0)
Hemoglobin: 12.7 g/dL (ref 11.7–15.5)
MCH: 29.5 pg (ref 27.0–33.0)
MCHC: 33 g/dL (ref 32.0–36.0)
MCV: 89.5 fL (ref 80.0–100.0)
MPV: 8.9 fL (ref 7.5–12.5)
Monocytes Relative: 5.4 %
Neutro Abs: 6069 {cells}/uL (ref 1500–7800)
Neutrophils Relative %: 61.3 %
Platelets: 432 Thousand/uL — ABNORMAL HIGH (ref 140–400)
RBC: 4.3 Million/uL (ref 3.80–5.10)
RDW: 12.2 % (ref 11.0–15.0)
Total Lymphocyte: 30 %
WBC: 9.9 Thousand/uL (ref 3.8–10.8)

## 2024-04-23 LAB — VON WILLEBRAND COMPREHENSIVE PANEL
Factor-VIII Activity: 120 %{normal} (ref 50–180)
Ristocetin Co-Factor: 77 %{normal} (ref 42–200)
Von Willebrand Antigen, Plasma: 109 % (ref 50–217)
aPTT: 27 s (ref 23–32)

## 2024-04-23 LAB — HCG, QUANTITATIVE, PREGNANCY: HCG, Total, QN: <5 m[IU]/mL

## 2024-04-23 LAB — PROTIME-INR
INR: 0.9
Prothrombin Time: 10.3 s (ref 9.0–11.5)

## 2024-04-23 LAB — PROLACTIN: Prolactin: 19.7 ng/mL

## 2024-04-23 LAB — TSH: TSH: 1.57 m[IU]/L

## 2024-04-23 MED ORDER — WEGOVY 1 MG/0.5ML ~~LOC~~ SOAJ: 1.0000 mg | SUBCUTANEOUS | 0 refills | Status: DC

## 2024-05-18 ENCOUNTER — Other Ambulatory Visit: Payer: Self-pay | Admitting: Nurse Practitioner

## 2024-05-18 ENCOUNTER — Encounter: Payer: Self-pay | Admitting: Nurse Practitioner

## 2024-05-18 DIAGNOSIS — K219 Gastro-esophageal reflux disease without esophagitis: Secondary | ICD-10-CM

## 2024-05-18 MED ORDER — WEGOVY 1 MG/0.5ML ~~LOC~~ SOAJ: 1.0000 mg | SUBCUTANEOUS | 1 refills | Status: DC

## 2024-05-18 MED ORDER — OMEPRAZOLE 20 MG PO CPDR
20.0000 mg | DELAYED_RELEASE_CAPSULE | Freq: Every day | ORAL | 1 refills | Status: AC
Start: 2024-05-18 — End: ?

## 2024-05-20 ENCOUNTER — Ambulatory Visit: Admitting: Nurse Practitioner

## 2024-06-18 ENCOUNTER — Other Ambulatory Visit: Payer: Self-pay | Admitting: Cardiology

## 2024-06-19 ENCOUNTER — Other Ambulatory Visit: Payer: Self-pay | Admitting: Nurse Practitioner

## 2024-06-19 ENCOUNTER — Telehealth: Payer: Self-pay | Admitting: Nurse Practitioner

## 2024-06-19 MED ORDER — WEGOVY 1 MG/0.5ML ~~LOC~~ SOAJ
1.0000 mg | SUBCUTANEOUS | 1 refills | Status: DC
Start: 2024-06-19 — End: 2024-06-19

## 2024-06-19 MED ORDER — WEGOVY 2.4 MG/0.75ML ~~LOC~~ SOAJ: 2.4000 mg | SUBCUTANEOUS | 11 refills | Status: DC

## 2024-06-19 NOTE — Telephone Encounter (Signed)
 Copied from CRM #[id number]. Topic: Clinical - Prescription Issue >> Jun 19, 2024  1:58 PM Wess RAMAN wrote: Reason for CRM: Patient states her Wegovy  shot dosages are incorrect. Patient is suppose to be on 2.4 but she is getting 1mg  and 1.7  Callback #: [id number]  Pharmacy: [facility] [address] ([address]), [place] - [address]) [place] [address] Phone: [phone] Fax: [phone] Hours: Not open 24 hours

## 2024-07-31 ENCOUNTER — Ambulatory Visit: Admitting: Nurse Practitioner

## 2024-07-31 ENCOUNTER — Encounter: Payer: Self-pay | Admitting: Nurse Practitioner

## 2024-07-31 VITALS — BP 120/79 | HR 110 | Temp 98.0°F | Ht 64.0 in | Wt 300.0 lb

## 2024-07-31 DIAGNOSIS — K047 Periapical abscess without sinus: Secondary | ICD-10-CM

## 2024-07-31 MED ORDER — NAPROXEN 500 MG PO TABS: 500.0000 mg | ORAL_TABLET | Freq: Two times a day (BID) | ORAL | 0 refills | Status: AC

## 2024-07-31 MED ORDER — TRAMADOL HCL 50 MG PO TABS: 50.0000 mg | ORAL_TABLET | Freq: Three times a day (TID) | ORAL | 0 refills | Status: AC | PRN

## 2024-07-31 MED ORDER — CLINDAMYCIN HCL 300 MG PO CAPS: 300.0000 mg | ORAL_CAPSULE | Freq: Three times a day (TID) | ORAL | 0 refills | Status: AC

## 2024-07-31 NOTE — Progress Notes (Signed)
 BP 120/79   Pulse (!) 110   Temp 98 F (36.7 C)   Ht 5' 4 (1.626 m)   Wt 300 lb (136.1 kg)   SpO2 95%   BMI 51.49 kg/m    Subjective:    Patient ID: Nicole Leonard, female    DOB: [birth year], 28 y.o.   MRN: [id number]  HPI: Nicole Leonard is a 28 y.o. female  Chief Complaint  Patient presents with   Sore Throat    Pt c/o sore throat/ neck pain with swallowing and moving on right side x1 week.    Discussed the use of AI scribe software for clinical note transcription with the patient, who gave verbal consent to proceed.  History of Present Illness Nicole Leonard is a 28 year old female who presents with sore throat and neck pain.  Oropharyngeal and cervical pain - Sore throat and neck pain localized to the right side, present for several days. - Pain worsens at night when lying down. - Pain is exacerbated by swallowing and head movement. - Physical tenderness noted on palpation of the affected area. - Pain radiates intermittently to the jaw and ears. - No associated fever or recent illness. - No medication taken specifically for throat pain.  Dental symptoms - History of problematic tooth described as 'dying'. - Suspects dental etiology for current symptoms. - Previous similar episode treated successfully with clindamycin . - Unable to obtain dental care due to insurance change; next dental appointment scheduled for January 29th. - Ibuprofen  taken for tooth pain with temporary relief.  Obesity management - Currently prescribed Wegovy  2.4 mg weekly for obesity. - Has not taken Wegovy  dose for the current week due to scheduling issues. - Engaged in lifestyle modifications including calorie deficit and increased physical activity. Flowsheet Row Office Visit from 07/31/2024 in [facility] Office Visit from 04/17/2024 in [facility] Office Visit from 02/18/2024 in [facility]  1 55 inches  55.5 inches 57 inches   Body mass index is 51.49 kg/m.  Wt Readings from Last 3 Encounters:  07/31/24 300 lb (136.1 kg)  04/17/24 (!) 305 lb 4.8 oz (138.5 kg)  03/17/24 298 lb (135.2 kg)     Medication allergies - Allergic to penicillins.         07/31/2024   11:22 AM 04/17/2024    9:58 AM 03/17/2024   10:29 AM  Depression screen PHQ 2/9  Decreased Interest 0 0 0  Down, Depressed, Hopeless 0 0 0  PHQ - 2 Score 0 0 0  Altered sleeping 0 0   Tired, decreased energy 0 0   Change in appetite 0 0   Feeling bad or failure about yourself  0 0   Trouble concentrating 0 0   Moving slowly or fidgety/restless 0 0   Suicidal thoughts 0 0   PHQ-9 Score 0 0    Difficult doing work/chores  Not difficult at all      Data saved with a previous flowsheet row definition    Relevant past medical, surgical, family and social history reviewed and updated as indicated. Interim medical history since our last visit reviewed. Allergies and medications reviewed and updated.  Review of Systems  Ten systems reviewed and is negative except as mentioned in HPI      Objective:      BP 120/79   Pulse (!) 110   Temp 98 F (36.7 C)   Ht 5' 4 (  1.626 m)   Wt 300 lb (136.1 kg)   SpO2 95%   BMI 51.49 kg/m    Wt Readings from Last 3 Encounters:  07/31/24 300 lb (136.1 kg)  04/17/24 (!) 305 lb 4.8 oz (138.5 kg)  03/17/24 298 lb (135.2 kg)    Physical Exam MEASUREMENTS: Weight- 300, BMI- 51.49. GENERAL: Alert, cooperative, well developed, no acute distress. HEENT: Normocephalic, normal oropharynx, moist mucous membranes, no lymphadenopathy, throat normal, dental abscess right lower back NECK: Right neck tender on palpation. CHEST: Clear to auscultation bilaterally, no wheezes, rhonchi, or crackles. CARDIOVASCULAR: Normal heart rate and rhythm, S1 and S2 normal without murmurs. ABDOMEN: Soft, non-tender, non-distended, without organomegaly, normal bowel sounds. EXTREMITIES: No cyanosis or  edema. NEUROLOGICAL: Cranial nerves grossly intact, moves all extremities without gross motor or sensory deficit.  Results for orders placed or performed in visit on 04/17/24  CBC with Differential/Platelet   Collection Time: 04/17/24 11:10 AM  Result Value Ref Range   WBC 9.9 3.8 - 10.8 Thousand/uL   RBC 4.30 3.80 - 5.10 Million/uL   Hemoglobin 12.7 11.7 - 15.5 g/dL   HCT 61.4 64.9 - 54.9 %   MCV 89.5 80.0 - 100.0 fL   MCH 29.5 27.0 - 33.0 pg   MCHC 33.0 32.0 - 36.0 g/dL   RDW 87.7 88.9 - 84.9 %   Platelets 432 (H) 140 - 400 Thousand/uL   MPV 8.9 7.5 - 12.5 fL   Neutro Abs 6,069 1,500 - 7,800 cells/uL   Absolute Lymphocytes 2,970 850 - 3,900 cells/uL   Absolute Monocytes 535 200 - 950 cells/uL   Eosinophils Absolute 287 15 - 500 cells/uL   Basophils Absolute 40 0 - 200 cells/uL   Neutrophils Relative % 61.3 %   Total Lymphocyte 30.0 %   Monocytes Relative 5.4 %   Eosinophils Relative 2.9 %   Basophils Relative 0.4 %  Comprehensive metabolic panel with GFR   Collection Time: 04/17/24 11:10 AM  Result Value Ref Range   Glucose, Bld 102 (H) 65 - 99 mg/dL   BUN 9 7 - 25 mg/dL   Creat 9.23 9.49 - 9.03 mg/dL   eGFR 890 > OR = 60 fO/fpw/8.26f7   BUN/Creatinine Ratio SEE NOTE: 6 - 22 (calc)   Sodium 139 135 - 146 mmol/L   Potassium 3.8 3.5 - 5.3 mmol/L   Chloride 108 98 - 110 mmol/L   CO2 23 20 - 32 mmol/L   Calcium 9.4 8.6 - 10.2 mg/dL   Total Protein 6.4 6.1 - 8.1 g/dL   Albumin 4.0 3.6 - 5.1 g/dL   Globulin 2.4 1.9 - 3.7 g/dL (calc)   AG Ratio 1.7 1.0 - 2.5 (calc)   Total Bilirubin 0.8 0.2 - 1.2 mg/dL   Alkaline phosphatase (APISO) 66 31 - 125 U/L   AST 14 10 - 30 U/L   ALT 20 6 - 29 U/L  TSH   Collection Time: 04/17/24 11:10 AM  Result Value Ref Range   TSH 1.57 mIU/L  Iron, TIBC and Ferritin Panel   Collection Time: 04/17/24 11:10 AM  Result Value Ref Range   Iron 63 40 - 190 mcg/dL   TIBC 620 749 - 549 mcg/dL (calc)   %SAT 17 16 - 45 % (calc)   Ferritin 13 (L)  16 - 154 ng/mL  Prolactin   Collection Time: 04/17/24 11:10 AM  Result Value Ref Range   Prolactin 19.7 ng/mL  Protime-INR   Collection Time: 04/17/24 11:10 AM  Result Value  Ref Range   INR 0.9    Prothrombin Time 10.3 9.0 - 11.5 sec  VON WILLEBRAND COMPREHENSIVE PANEL   Collection Time: 04/17/24 11:10 AM  Result Value Ref Range   INTERPRETATION see note    aPTT 27 23 - 32 sec   Factor-VIII Activity 120 50 - 180 % normal   Von Willebrand Antigen, Plasma 109 50 - 217 %   Ristocetin Co-Factor 77 42 - 200 % normal   Von Willebrand Multimers see note   hCG, quantitative, pregnancy   Collection Time: 04/17/24 11:10 AM  Result Value Ref Range   HCG, Total, QN <5 mIU/mL          Assessment & Plan:   Problem List Items Addressed This Visit   None Visit Diagnoses       Tooth abscess    -  Primary   Relevant Medications   clindamycin  (CLEOCIN ) 300 MG capsule   naproxen  (NAPROSYN ) 500 MG tablet   traMADol (ULTRAM) 50 MG tablet        Assessment and Plan Assessment & Plan Periapical dental abscess Presents with sore throat, neck pain, and jaw pain, likely related to a periapical dental abscess. Pain is sharp, radiates to the ear, and worsens when lying down. No fever or systemic symptoms. Allergic to penicillins, but no adverse reaction to clindamycin  in 2023. Delayed dental extraction due to insurance change, with appointment scheduled for January 29th. Potential for earlier extraction if she pays out of pocket. - Prescribed clindamycin  for 7 days, three times a day. - Prescribed naproxen  twice a day for pain management. - Prescribed a strong pain medication for severe pain, with caution regarding its status as a controlled substance and driving restrictions. tramadol - Advised to follow up with the dentist for extraction of the affected tooth.  Obesity BMI of 51.49. Currently on Wegovy  2.4 mg weekly. Reports difficulty maintaining a consistent schedule for medication  administration due to personal circumstances, potentially affecting efficacy. - Continue Wegovy  2.4 mg weekly. - Encouraged adherence to a consistent medication schedule to optimize efficacy. - Encourage continuation of lifestyle modifications, including dietary management and regular exercise. -continue to increase physical activity, getting at least 150 min of physical activity a week.  Work on including runner, broadcasting/film/video 2 days a week.  - continue eating at a calorie deficit 1600-1700 cal a day, eating a well balanced diet with whole foods, avoiding processed foods.   Patient is motivated to continue working on lifestyle modification.          Follow up plan: Return if symptoms worsen or fail to improve.

## 2024-09-14 ENCOUNTER — Other Ambulatory Visit: Payer: Self-pay | Admitting: Nurse Practitioner

## 2024-09-14 ENCOUNTER — Encounter: Payer: Self-pay | Admitting: Nurse Practitioner

## 2024-09-14 DIAGNOSIS — I10 Essential (primary) hypertension: Secondary | ICD-10-CM

## 2024-09-15 MED ORDER — WEGOVY 2.4 MG/0.75ML ~~LOC~~ SOAJ: 2.4000 mg | SUBCUTANEOUS | 11 refills | Status: AC

## 2024-09-15 MED ORDER — LABETALOL HCL 100 MG PO TABS: 100.0000 mg | ORAL_TABLET | Freq: Two times a day (BID) | ORAL | 1 refills | Status: AC

## 2024-09-15 NOTE — Telephone Encounter (Signed)
 Requested Prescriptions  Refused Prescriptions Disp Refills   labetalol  (NORMODYNE ) 100 MG tablet [Pharmacy Med Name: Labetalol  HCl 100 MG Oral Tablet] 60 tablet 0    Sig: Take 1 tablet by mouth twice daily     Cardiovascular:  Beta Blockers Failed - 09/15/2024  3:57 PM      Failed - Valid encounter within last 6 months    Recent Outpatient Visits           1 month ago Tooth abscess   [facility] [name], FNP   5 months ago Menorrhagia with regular cycle   [facility] [name], FNP   7 months ago Morbid obesity [facility])   [facility] [name], FNP   7 months ago Anxiety   [facility] [name], FNP   8 months ago Positive pregnancy test   [facility] [name], FNP              Passed - Last BP in normal range    BP Readings from Last 1 Encounters:  07/31/24 120/79         Passed - Last Heart Rate in normal range    Pulse Readings from Last 1 Encounters:  07/31/24 (!) 110
